# Patient Record
Sex: Male | Born: 1950 | Race: White | Hispanic: No | Smoking: Never smoker
Health system: Southern US, Community
[De-identification: ages and names within clinical notes are randomized; demographics above are authoritative.]

## PROBLEM LIST (undated history)

## (undated) DIAGNOSIS — R1013 Epigastric pain: Secondary | ICD-10-CM

## (undated) DIAGNOSIS — E785 Hyperlipidemia, unspecified: Secondary | ICD-10-CM

## (undated) DIAGNOSIS — I2510 Atherosclerotic heart disease of native coronary artery without angina pectoris: Secondary | ICD-10-CM

## (undated) DIAGNOSIS — Z006 Encounter for examination for normal comparison and control in clinical research program: Secondary | ICD-10-CM

## (undated) DIAGNOSIS — I255 Ischemic cardiomyopathy: Secondary | ICD-10-CM

## (undated) HISTORY — DX: Ischemic cardiomyopathy: I25.5

## (undated) HISTORY — PX: INGUINAL HERNIA REPAIR: SUR1180

## (undated) HISTORY — DX: Epigastric pain: R10.13

## (undated) HISTORY — DX: Encounter for examination for normal comparison and control in clinical research program: Z00.6

## (undated) HISTORY — PX: UMBILICAL HERNIA REPAIR: SHX196

## (undated) HISTORY — DX: Hyperlipidemia, unspecified: E78.5

## (undated) HISTORY — DX: Atherosclerotic heart disease of native coronary artery without angina pectoris: I25.10

---

## 2008-07-25 ENCOUNTER — Ambulatory Visit: Payer: Self-pay | Admitting: Cardiology

## 2008-07-25 ENCOUNTER — Inpatient Hospital Stay (HOSPITAL_COMMUNITY): Admission: EM | Admit: 2008-07-25 | Discharge: 2008-07-27 | Payer: Self-pay | Admitting: Cardiology

## 2008-08-18 ENCOUNTER — Ambulatory Visit: Payer: Self-pay | Admitting: Cardiology

## 2008-09-19 ENCOUNTER — Ambulatory Visit: Payer: Self-pay

## 2008-09-19 ENCOUNTER — Encounter: Payer: Self-pay | Admitting: Cardiology

## 2008-09-21 ENCOUNTER — Ambulatory Visit: Payer: Self-pay | Admitting: Cardiology

## 2008-12-29 DIAGNOSIS — I2510 Atherosclerotic heart disease of native coronary artery without angina pectoris: Secondary | ICD-10-CM

## 2008-12-29 DIAGNOSIS — I255 Ischemic cardiomyopathy: Secondary | ICD-10-CM

## 2008-12-29 DIAGNOSIS — E785 Hyperlipidemia, unspecified: Secondary | ICD-10-CM

## 2008-12-29 DIAGNOSIS — Z9861 Coronary angioplasty status: Secondary | ICD-10-CM

## 2008-12-30 ENCOUNTER — Encounter: Payer: Self-pay | Admitting: Cardiology

## 2008-12-30 ENCOUNTER — Ambulatory Visit: Payer: Self-pay | Admitting: Cardiology

## 2009-01-03 ENCOUNTER — Ambulatory Visit: Payer: Self-pay | Admitting: Cardiology

## 2009-01-03 LAB — CONVERTED CEMR LAB
AST: 20 units/L (ref 0–37)
Alkaline Phosphatase: 62 units/L (ref 39–117)
BUN: 12 mg/dL (ref 6–23)
Basophils Absolute: 0.1 10*3/uL (ref 0.0–0.1)
Bilirubin, Direct: 0.2 mg/dL (ref 0.0–0.3)
CO2: 29 meq/L (ref 19–32)
Calcium: 8.7 mg/dL (ref 8.4–10.5)
Chloride: 105 meq/L (ref 96–112)
Cholesterol: 120 mg/dL (ref 0–200)
Creatinine, Ser: 0.9 mg/dL (ref 0.4–1.5)
Eosinophils Absolute: 0.4 10*3/uL (ref 0.0–0.7)
HCT: 41.8 % (ref 39.0–52.0)
Hemoglobin: 14.3 g/dL (ref 13.0–17.0)
LDL Cholesterol: 69 mg/dL (ref 0–99)
Lymphs Abs: 1.9 10*3/uL (ref 0.7–4.0)
MCHC: 34.3 g/dL (ref 30.0–36.0)
Monocytes Absolute: 0.7 10*3/uL (ref 0.1–1.0)
Neutro Abs: 3.4 10*3/uL (ref 1.4–7.7)
Platelets: 185 10*3/uL (ref 150.0–400.0)
RDW: 12.4 % (ref 11.5–14.6)
Total CHOL/HDL Ratio: 3

## 2009-06-08 ENCOUNTER — Ambulatory Visit: Payer: Self-pay | Admitting: Cardiology

## 2009-06-08 DIAGNOSIS — R5381 Other malaise: Secondary | ICD-10-CM

## 2009-06-08 DIAGNOSIS — R5383 Other fatigue: Secondary | ICD-10-CM

## 2009-06-20 ENCOUNTER — Encounter (INDEPENDENT_AMBULATORY_CARE_PROVIDER_SITE_OTHER): Payer: Self-pay | Admitting: *Deleted

## 2009-06-20 LAB — CONVERTED CEMR LAB
Basophils Relative: 0.3 % (ref 0.0–3.0)
Calcium: 8.6 mg/dL (ref 8.4–10.5)
Creatinine, Ser: 0.9 mg/dL (ref 0.4–1.5)
Eosinophils Absolute: 0.4 10*3/uL (ref 0.0–0.7)
Eosinophils Relative: 5.4 % — ABNORMAL HIGH (ref 0.0–5.0)
GFR calc non Af Amer: 92.54 mL/min (ref >60)
Lymphocytes Relative: 25.5 % (ref 12.0–46.0)
MCHC: 35.5 g/dL (ref 30.0–36.0)
Monocytes Relative: 10.2 % (ref 3.0–12.0)
Neutrophils Relative %: 58.6 % (ref 43.0–77.0)
RBC: 4.2 M/uL — ABNORMAL LOW (ref 4.22–5.81)
WBC: 7 10*3/uL (ref 4.5–10.5)

## 2010-11-05 ENCOUNTER — Ambulatory Visit
Admission: RE | Admit: 2010-11-05 | Discharge: 2010-11-05 | Payer: Self-pay | Source: Home / Self Care | Attending: Cardiology | Admitting: Cardiology

## 2010-11-14 NOTE — Assessment & Plan Note (Signed)
Summary: ec6. previous pt of dr. Juanda Chance, cad. pt has bcbs. gd      Allergies Added:    Visit Type:  Follow-up Primary Provider:  Dr. Alben Spittle ([facility])  CC:  chest pain.  History of Present Illness: The patient presents for yearly followup. Since he was last seen here he has had no new cardiovascular problems. He exercises aerobically routinely and watches his diet. He has had no new cardiovascular symptoms and denies any chest pressure, neck or arm discomfort. He has had no palpitations, presyncope or syncope. He has had no weight gain or edema.  Current Medications (verified): 1)  Aspirin 81 Mg Tbec (Aspirin) .... Take One Tablet By Mouth Daily 2)  Crestor 20 Mg Tabs (Rosuvastatin Calcium) .... Take One Tablet By Mouth Daily. 3)  Plavix 75 Mg Tabs (Clopidogrel Bisulfate) .... Take One Tablet By Mouth Daily 4)  Metoprolol Succinate 25 Mg Xr24h-Tab (Metoprolol Succinate) .... Take One Tablet By Mouth Daily 5)  Nitroglycerin 0.4 Mg Subl (Nitroglycerin) .... One Tablet Under Tongue Every 5 Minutes As Needed For Chest Pain---May Repeat Times Three  Allergies (verified): 1)  ! Cordelia Poche  Past History:  Past Medical History: Reviewed history from 12/29/2008 and no changes required. 1. 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 2. Ejection fraction of 35-40%, now improved at 55% by recent echo. 3. Positive family history of coronary heart disease. 4. Hyperlipidemia.   Review of Systems       As stated in the HPI and negative for all other systems.   Vital Signs:  Patient profile:   60 year old male Height:      67 inches Weight:      197 pounds BMI:     30.97 Pulse rate:   67 / minute Resp:     16 per minute BP sitting:   142 / 76  (right arm)  Vitals Entered By: Marrion Coy, CNA (November 05, 2010 10:58 AM)  Physical Exam  General:  Well  developed, well nourished, in no acute distress. Head:  normocephalic and atraumatic Neck:  Neck supple, no JVD. No masses, thyromegaly or abnormal cervical nodes. Chest Wall:  no deformities or breast masses noted Lungs:  Clear bilaterally to auscultation and percussion. Abdomen:  Bowel sounds positive; abdomen soft and non-tender without masses, organomegaly, or hernias noted. No hepatosplenomegaly. Msk:  Back normal, normal gait. Muscle strength and tone normal. Extremities:  No clubbing or cyanosis. Neurologic:  Alert and oriented x 3. Skin:  Intact without lesions or rashes. Cervical Nodes:  no significant adenopathy Inguinal Nodes:  no significant adenopathy Psych:  Normal affect.   Detailed Cardiovascular Exam  Neck    Carotids: Carotids full and equal bilaterally without bruits.      Neck Veins: Normal, no JVD.    Heart    Inspection: no deformities or lifts noted.      Palpation: normal PMI with no thrills palpable.      Auscultation: regular rate and rhythm, S1, S2 without murmurs, rubs, gallops, or clicks.    Vascular    Abdominal Aorta: no palpable masses, pulsations, or audible bruits.      Femoral Pulses: normal femoral pulses bilaterally.      Pedal Pulses: normal pedal pulses bilaterally.      Radial Pulses: normal radial pulses bilaterally.      Peripheral Circulation: no  clubbing, cyanosis, or edema noted with normal capillary refill.     EKG  Procedure date:  11/05/2010  Findings:      Sinus rhythm, rate 67, axis within normal limits, intervals within normal limits, poor anterior R-wave progression  Impression & Recommendations:  Problem # 1:  CAD, NATIVE VESSEL (ICD-414.01) He has had no new symptoms. He anticipates aggressively and risk reduction. No change in therapy or further testing is indicated. I did review the results of his catheterization from 2009. He had very minimal plaque elsewhere. Orders: EKG w/ Interpretation (93000)  Problem # 2:   HYPERLIPIDEMIA-MIXED (ICD-272.4) He does not recall the specifics of his last lipid profile. This is followed by his primary provider. We did screen him for one of our clinical trials regarding HDL and we will let his primary provider note if he agrees to participate.  Problem # 3:  CARDIOMYOPATHY, ISCHEMIC (ICD-414.8) This has resolved. He had no significant LV dysfunction because of timely revascularization. Orders: EKG w/ Interpretation (93000)  Patient Instructions: 1)  Your physician wants you to follow-up in:  1 year with Dr Hoyle Barr will receive a reminder letter in the mail two months in advance. If you don't receive a letter, please call our office to schedule the follow-up appointment. 2)  Your physician recommends that you continue on your current medications as directed. Please refer to the Current Medication list given to you today.

## 2011-02-19 NOTE — Cardiovascular Report (Signed)
NAMEDAMIEL, BARTHOLD NO.:  [id number]   MEDICAL RECORD NO.:  [id number]          PATIENT TYPE:  OIB   LOCATION:  2905                         FACILITY:  [facility]   PHYSICIAN:  [name]. Juanda Chance, MD, FACCDATE OF BIRTH:  [birth year]   DATE OF PROCEDURE:  07/25/2008  DATE OF DISCHARGE:                            CARDIAC CATHETERIZATION   CLINICAL HISTORY:  Mr. Stonebraker is 60 years old and works Advertising copywriter  for cars.  He has no prior history of known heart disease, but does have  an elevated cholesterol.  He developed chest pain and went to the [facility] where his ECG showed an acute anterior wall  infarction.  He was transferred promptly to [facility] for  intervention.  He was initially seen by Dr. Swaziland and Dr. Swaziland  performed a diagnostic procedure, but the family requested our group to  be involved and I performed the intervention.   PROCEDURE:  The diagnostic procedure was performed by Dr. Swaziland which  showed minimal irregularities in the right coronary artery and  circumflex artery and 95% focal stenosis in the proximal LAD with TIMI  III flow.  His left ventriculogram showed anterolateral and apical wall  hypo-to-akinesis with an estimated ejection fraction of 35-40%.   The patient was given bivalirudin bolus and infusion.  He was given 600  mg of Plavix and he was given 4 chewable aspirin.  We used a Q4 6-French  guiding catheter with side holes.  We passed a Prowater wire down the  LAD across the lesion without difficulty.  We predilated with a 3.0 x 15-  mm apex balloon performing 1 inflation of 8 atmospheres for 30 seconds.  We then deployed a 3.0 x 15-mm endeavor stent deploying this with 1  inflation of 15 atmospheres for 30 seconds.  We postdilated with a 3.5 x  12-mm [place] Voyager performing 1 inflation up to 15 atmospheres for 30  seconds.  Final diagnostics were then performed through the guiding  catheter.  The patient  tolerated the procedure well and left the  laboratory in satisfactory condition.  Right femoral artery was closed  with Angio-Seal at the end of the procedure.   RESULTS:  Initially, the stenosis in the proximal LAD was estimated at  95%.  Following stenting, this improved to 0%.  Flow was TIMI III before  and after the intervention.   The patient had pain at 1345 and arrived in the [facility] at 1357.  He arrived in the cath lab at 1453 and the first  balloon inflation was at 1534.  I was scrubbed in procedure and there  was about a 10-minute delay from the time Dr. Swaziland finished the  diagnostic procedure until I was available for the intervention.  The  total balloon time was 97 minutes.   CONCLUSION:  1. Acute anterior wall myocardial infarction with 95% stenosis in the      proximal LAD, no major obstruction in the circumflex and right      coronary arteries and anterolateral  and apical wall hypo-to-      akinesis with an estimated ejection fraction of 35-40%.  2. Successful PCI of the lesion in the proximal LAD using a Medtronic      endeavor drug-eluting stent with improvement in the central      narrowing from 95%-0%.   DISPOSITION:  The patient was returned to postanesthesia unit for  further observation.      Bruce Elvera Lennox Juanda Chance, MD, [facility]  Electronically Signed     BRB/MEDQ  D:  07/25/2008  T:  07/26/2008  Job:  [id number]   cc:   [name], M.D.  Cecil Cranker

## 2011-02-19 NOTE — Assessment & Plan Note (Signed)
[facility] HEALTHCARE                            CARDIOLOGY OFFICE NOTE   Douglas, Kelley                           MRN:          [id number]  DATE:09/21/2008                            DOB:          Apr 07, 1951    PRIMARY CARE PHYSICIAN:  Dr. Daneil Dolin in [place].   CLINICAL HISTORY:  [name] is a 60 year old and returned for followup  management of his coronary heart disease after his recent heart attack.  He works as an Production manager for cars and his wife,  Douglas Kelley, works in recovery at [facility] [facility].  He was admitted to the  [facility] with an anterior wall  infarction, transferred to [facility], and underwent stenting of the proximal  LAD with an Endeavor drug-eluting stent.  The diagnostic procedure was  performed by Dr. Swaziland and I performed the intervention.  His ejection  fraction is good at 35-40%, and he had no disease in the circumflex or  right coronary artery.   He has done quite well since that time.  He has had no recurrent chest  pain, shortness of breath, or palpitations.  He is back at work.   He had an echocardiogram today which showed an ejection fraction of 55%,  only very mild apical hypokinesis.   His past medical history is negative for hypertension and diabetes.  He  does have hyperlipidemia.   He has an allergy or an intolerance to [facility].   His current medications include:  1. Crestor.  2. Aspirin.  3. Plavix.  4. Lotensin 5 mg daily.  5. Metoprolol 25 mg daily.   On examination, the blood pressure was 113/75 and pulse 49 and regular.  There was no venous distension.  Carotid pulses were full without  bruits.  Chest was clear.  Cardiac rhythm was regular.  He had no  murmurs or gallops.  The abdomen was soft with normal bowel sounds.  Peripheral pulses were full.  There was no peripheral edema.   IMPRESSION:  1. Coronary artery status post anterior wall myocardial  infarction,      July 25, 2008, treated with an Endeavor drug-eluting stent to      the left anterior descending.  2. Ejection fraction of 35-40%, now improved at 55% by recent echo.  3. Positive family history of coronary heart disease.  4. Hyperlipidemia.   RECOMMENDATIONS:  Mr. Rubenstein is doing quite well.  Laboratory studies  done at [facility] showed total cholesterol of 104 and HDL of 35 and  LDL of 62.  LDL has come down dramatically from his previous readings.   We will plan to continue his current medications.  I will plan to see  him back in 3 months.  We will probably consider getting him off  Lotensin or the metoprolol and followup with him.      Bruce Elvera Lennox Juanda Chance, MD, [facility]  Electronically Signed    BRB/MedQ  DD: 09/21/2008  DT: 09/21/2008  Job #: [id number]

## 2011-02-19 NOTE — Discharge Summary (Signed)
[name], [name]                  ACCOUNT NO.:  [id number]   MEDICAL RECORD NO.:  [id number]          PATIENT TYPE:  OIB   LOCATION:  2921                         FACILITY:  [facility]   PHYSICIAN:  [name]. Juanda Chance, MD, FACCDATE OF BIRTH:  09-15-1951   DATE OF ADMISSION:  07/25/2008  DATE OF DISCHARGE:  07/27/2008                               DISCHARGE SUMMARY   PROCEDURES:  1. Cardiac catheterization.  2. Coronary arteriogram.  3. Left ventriculogram.   PRIMARY FINAL DISCHARGE DIAGNOSES:  1. ST elevation anterior myocardial infarction.  2. Dyslipidemia with a total cholesterol of 163, triglycerides 68, HDL      39, and LDL 110 on this admission.  3. Ischemic cardiomyopathy with an ejection fraction of 35-40% by cath      this admission.  4. Allergy or intolerance to Levaquin.  5. Remote history of hernia repair and hand surgeries.  6. Borderline obesity with a body mass index of 30.3 kg.   TIME AT DISCHARGE:  37 minutes.   HOSPITAL COURSE:  [name] is a 60 year old male with no previous of  coronary artery disease.  He had been having left arm discomfort and  chest discomfort with exertion.  The discomfort became more pronounced  and then unbearable.  His EKG showed an ST elevation anterior MI and he  was brought urgently to [facility] into the Cath Lab.   The cardiac catheterization showed a 95% proximal LAD, treated with an  Endeavor drug-eluting stent reducing the stenosis to zero.  The  circumflex and RCA were basically normal with only some luminal  irregularities in the RCA.  His EF was 35%-40% with anterolateral and  apical akinesis.   Mr. [name] was started on Crestor 20 mg for dyslipidemia.  He was seen by  cardiac rehab.  He was enrolled in the ADAPT-DES study and will be  followed by research.  He had low-dose ACE inhibitor and beta-blocker  added to his medication regimen and is currently tolerating those fairly  well.   On July 27, 2008, Mr. [name]  was ambulating with cardiac rehab and had  no chest pain or shortness of breath.  His vitals were stable.  Dr.  Juanda Chance evaluated Mr. [name] and considered him stable for discharge with  close outpatient followup.   DISCHARGE INSTRUCTIONS:  His activity levels to include, no driving for  5 days and no lifting for 3 weeks.  He is to stick to a low-sodium heart-  healthy diet.  He is to call our office for problems with the cath site.  He is to follow up with Dr. Juanda Chance on August 18, 2008, at 11:45 and  with Dr. Alben Spittle at [facility] as needed.   DISCHARGE MEDICATIONS:  1. Crestor 20 mg daily.  2. Aspirin 325 mg daily.  3. Plavix 75 mg daily.  4. Nitroglycerin sublingual p.r.n.  5. Lotensin 5 mg daily.  6. Metoprolol ER 25 mg a day.      Theodore Demark, PA-C      Bruce R. Juanda Chance, MD, [facility]  Electronically Signed  RB/MEDQ  D:  07/27/2008  T:  07/28/2008  Job:  [id number]   cc:   Dr. Alben Spittle

## 2011-02-19 NOTE — Assessment & Plan Note (Signed)
[facility]ALTHCARE                            CARDIOLOGY OFFICE NOTE   SHERLOCK, NANCARROW                           MRN:          [id number]  DATE:08/18/2008                            DOB:          01/08/1951    PRIMARY CARE PHYSICIAN:  Tereso Newcomer, PA-C   CLINICAL HISTORY:  Mr. Vazquez is 60 years old and returned for a followup  visit after his recent anterior infarction.  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  His wife, [name], works in  recovery at [employer], who is with him today.  He developed chest  pain and went to the [facility], where his  ECG showed an acute anterior wall infarction.  His catheterization was  done by Dr. Peter Swaziland, but his family requested [place], and so I did  the intervention.  He had a high-grade lesion in the proximal LAD that  we treated with a Medtronic Endeavor drug-eluting stent.  His ejection  fraction was 35-40%.  He had no significant disease in the circumflex or  right coronary artery.   He has done quite well since discharge with no chest pain, shortness of  breath, or palpitations.   PAST MEDICAL HISTORY:  Negative for major medical illnesses.  He has had  no history of diabetes, hypertension, or known hypertension.  He has had  a history of hyperlipidemia, was treated with Pravachol in the past, but  not recently.  He has an allergy or intolerance to [facility].  He has a  history of a hernia repair.   CURRENT MEDICATIONS:  1. Aspirin.  2. Plavix.  3. Crestor 20 mg daily.  4. Lotensin 5 mg daily.  5. Metoprolol 25 mg daily.   PHYSICAL EXAMINATION:  VITAL SIGNS:  Blood pressure is 110/76 and pulse  70 and regular.  NECK:  There is no venous distension.  The carotid pulses are full  without bruits.  CHEST:  Clear.  CARDIAC:  Rhythm is regular.  There are no murmurs or gallops.  ABDOMEN:  Soft with normal bowel sounds.  EXTREMITIES:  Peripheral pulses  are full with no peripheral edema.   An electrocardiogram showed recent anterior wall infarction with  anterolateral T-wave changes.   IMPRESSION:  1. Coronary artery disease with recent anterior wall myocardial      infarction, treated with a drug-eluting stent to the left anterior      descending coronary artery.  2. Ejection fraction 35-40%.  3. Positive family history of coronary artery disease.  4. Hyperlipidemia.   RECOMMENDATIONS:  I think, Mr. Hanshaw is doing very well.  I think, it is  okay for him to return to work with no plans for driving in the next  month.  He and his wife had initially planned to go on a skiing trip in  January, but I think with Plavix, it is best that he not do that.  We  will try and get him involved in the cardiac rehab program.  We will  plan to see him back  in 4 weeks, and I will do an echocardiogram before  that visit to evaluate the recovery of his left ventricular function.     Bruce Elvera Lennox Juanda Chance, MD, [facility]  Electronically Signed    BRB/MedQ  DD: 08/18/2008  DT: 08/19/2008  Job #: [id number]

## 2011-02-19 NOTE — H&P (Signed)
NAMEVAUGHN, Douglas [name]:  [id number]   MEDICAL RECORD NO.:  [id number]          PATIENT TYPE:  OIB   LOCATION:  2905                         FACILITY:  [facility]   PHYSICIAN:  [name]. Juanda Chance, MD, FACCDATE OF BIRTH:  December 02, 1950   DATE OF ADMISSION:  07/25/2008  DATE OF DISCHARGE:                              HISTORY & PHYSICAL   CARDIOLOGIST:  [name]. Juanda Chance, MD, [facility]   PRIMARY CARE PHYSICIAN:  Dr. Alben Spittle.   HISTORY OF PRESENT ILLNESS:  Douglas Douglas Kelley is a 60 year old Caucasian  gentleman with no known coronary artery disease history who presents  with 3 weeks of intermittent chest discomfort brought on with exertion  and associated shortness of breath with GERD-type symptoms.  Douglas Douglas Kelley  is relatively active, works out at [employer] as often as he can.  He is an  Hotel manager.  Does a lot of traveling for work, was in [place]  just a couple of weeks ago walking through customs at the report  experienced some heaviness in his chest associated with shortness of  breath.  This continued for 10-50 minutes and then became associated  with numbness and tingling in his left arm.  He has tried some PPI  without relief in his symptoms.  Today, the discomfort became more  pronounced.  He was actually driving to his primary care office to get  evaluated when pain became unbearable, and he proceeded [facility] the [facility] at [facility] [address] where he was diagnosed with an ST  elevated MI anterior lateral.  The patient was transported urgently to  [facility] to the cath lab and is currently being prepped for cardiac  catheterization by Dr. Charlies Constable.   PAST MEDICAL HISTORY:  Hernia repair, remote hand surgery and mildly  elevated cholesterol.   ALLERGIES:  Questionable allergy to [facility].   MEDICATIONS:  No prescription medications.   SOCIAL HISTORY:  He lives at [facility] with his wife.  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  He drinks socially.   Denies  any tobacco use.  Works out at [employer]   FAMILY HISTORY:  Noncontributory for CAD.   REVIEW OF SYSTEMS:  Positive for chest pain, shortness of breath with  exertion, GERD symptoms, and left arm discomfort.   PHYSICAL EXAMINATION:  VITAL SIGNS:  Temperature 97.3, blood pressure  152/99, heart rate 88, 100% on 2 liters, and respirations 20.  GENERAL:  Douglas Douglas Kelley complaining of chest discomfort and left arm  discomfort.  HEENT: Unremarkable.  NECK: Supple without lymphadenopathy, no bruits.  CARDIOVASCULAR:  S1 and S2.  Regular rate and rhythm.  LUNGS: Clear to auscultation.  ABDOMEN: Soft, nontender, and positive bowel sounds.  EXTREMITIES:  Lower extremities without clubbing, cyanosis or edema.  NEUROLOGICAL:  Alert and oriented x3.   LABORATORY DATA:  Chest x-ray is pending.  A 12-lead EKG is pending.  However, EKG obtained at the [facility] road showing ST  depression with ST elevation anterior lateral leads.  Lab work is  pending.   IMPRESSION:  ST-segment elevation myocardial infarction.  The patient  being prepped for cardiac catheterization.  He would be admitted to CCU  for further care postcatheterization.  The risks and benefits of cardiac  catheterization have been discussed with the patient.  The patient  agrees to proceed.      Dorian Pod, ACNP      Bruce R. Juanda Chance, MD, [facility]  Electronically Signed    MB/MEDQ  D:  07/25/2008  T:  07/26/2008  Job:  [id number]

## 2011-02-19 NOTE — Cardiovascular Report (Signed)
NAME:  Douglas Kelley, TRACZ NO.:  [id number]   MEDICAL RECORD NO.:  [id number]           PATIENT TYPE:   LOCATION:                                 FACILITY:   PHYSICIAN:  [name], M.D.  DATE OF BIRTH:  1951-03-19   DATE OF PROCEDURE:  07/25/2008  DATE OF DISCHARGE:                            CARDIAC CATHETERIZATION   INDICATION FOR PROCEDURE:  A 60 year old white male with history of  hypercholesterolemia presented to the [facility] Emergency Department at  [facility] with acute anterolateral myocardial infarction.  He had  ongoing chest pain.  He was treated with IV heparin, nitroglycerin,  Lopressor, and oral Plavix in the emergency department and transported  emergently to cardiac catheterization laboratory.   PROCEDURES:  1. Left heart catheterization.  2. Coronary and left ventricular angiography.   EQUIPMENTS USED:  1. A 6-French 4 cm right and left Judkins catheter.  2. A 6-French pigtail catheter.  3. A 6-French arterial sheath.   MEDICATIONS:  Local anesthesia 1% Xylocaine and fentanyl 25 mg IV.   ACT was 238 seconds at the beginning of procedure.   CONTRAST:  90 mL of Omnipaque for the diagnostic procedure.   HEMODYNAMIC DATA:  Aortic pressure was 127/87 with a mean of 106 mmHg.  Left ventricle pressure is 113 with EDP of 12 mmHg.   ANGIOGRAPHIC DATA:  The left coronary artery arises and distributes  normally.  The left main coronary has 10-20% narrowing in the distal  vessel.   The left anterior descending artery is a large vessel extending out to  the apex.  It gives rise 2 diagonal branches.  In the proximal vessel,  there is a high-grade focal stenosis up to 99% with small filling defect  consistent with thrombus.  There is TIMI grade 2 flow to the distal LAD.  Remainder of the LAD and diagonal vessels are without significant  disease.   The left circumflex coronary artery gives rise to small intermediate  branch.  There is then a  large first obtuse marginal vessel.  The  circumflex then extends in the AV groove giving rise to a more  posterolateral branch.  Left circumflex coronary has only mild  irregularities, less than 10%.   Right coronary artery arises and distributes normally.  It is a large  dominant vessel and appears normal throughout its course.   Left ventricular angiography was performed in the RAO view.  This  demonstrates normal left ventricle size.  There was moderate  anterolateral hypokinesia with overall mild left ventricular  dysfunction.  Ejection fraction is estimated at 45%.  There is no  significant mitral insufficiency.  There is minimal prolapse of the  posterior leaflet of mitral valve.   FINAL INTERPRETATION:  1. Single-vessel obstructive atherosclerotic coronary artery disease      involving the proximal LAD.  2. Mild left ventricular dysfunction with anterolateral wall motion      abnormality.   PLAN:  The patient was referred for catheter-based intervention of the  proximal LAD, to be performed by Dr. Charlies Constable.  ______________________________  [name], M.D.     PMJ/MEDQ  D:  07/26/2008  T:  07/26/2008  Job:  [id number]   cc:   [name]. Juanda Chance, MD, [facility]

## 2011-05-21 ENCOUNTER — Other Ambulatory Visit: Payer: Self-pay | Admitting: *Deleted

## 2011-05-21 MED ORDER — ATORVASTATIN CALCIUM 80 MG PO TABS: 80.0000 mg | ORAL_TABLET | Freq: Every day | ORAL | Status: DC

## 2011-05-30 ENCOUNTER — Other Ambulatory Visit: Payer: Self-pay | Admitting: *Deleted

## 2011-05-30 MED ORDER — ATORVASTATIN CALCIUM 80 MG PO TABS: 80.0000 mg | ORAL_TABLET | Freq: Every day | ORAL | Status: DC

## 2011-07-09 LAB — CARDIAC PANEL(CRET KIN+CKTOT+MB+TROPI)
CK, MB: 235.6 — ABNORMAL HIGH
Relative Index: 8.5 — ABNORMAL HIGH
Total CK: 1509 — ABNORMAL HIGH
Total CK: 2467 — ABNORMAL HIGH
Troponin I: 43.36

## 2011-07-09 LAB — BASIC METABOLIC PANEL
BUN: 5 — ABNORMAL LOW
Calcium: 8.7
Calcium: 9
Chloride: 104
Creatinine, Ser: 0.77
GFR calc Af Amer: >60
GFR calc Af Amer: >60
GFR calc non Af Amer: >60
Potassium: 3.8
Sodium: 139

## 2011-07-09 LAB — LIPID PANEL
Cholesterol: 163
HDL: 39 — ABNORMAL LOW
Total CHOL/HDL Ratio: 4.2

## 2011-07-09 LAB — POCT I-STAT, CHEM 8
Creatinine, Ser: 0.6
HCT: 39
Hemoglobin: 13.3
Potassium: 3.4 — ABNORMAL LOW
Sodium: 125 — ABNORMAL LOW

## 2011-07-09 LAB — CBC
HCT: 41.6
Hemoglobin: 14.3
MCHC: 34.5
MCV: 93.6
Platelets: 212
RBC: 4.42
RBC: 4.61
WBC: 10.1

## 2011-08-27 ENCOUNTER — Telehealth: Payer: Self-pay | Admitting: Cardiology

## 2011-08-27 ENCOUNTER — Ambulatory Visit (INDEPENDENT_AMBULATORY_CARE_PROVIDER_SITE_OTHER): Payer: BC Managed Care – PPO | Admitting: Internal Medicine

## 2011-08-27 ENCOUNTER — Encounter: Payer: Self-pay | Admitting: *Deleted

## 2011-08-27 ENCOUNTER — Encounter: Payer: Self-pay | Admitting: Internal Medicine

## 2011-08-27 VITALS — BP 125/79 | HR 68 | Ht 67.0 in | Wt 189.0 lb

## 2011-08-27 DIAGNOSIS — I2510 Atherosclerotic heart disease of native coronary artery without angina pectoris: Secondary | ICD-10-CM

## 2011-08-27 DIAGNOSIS — R12 Heartburn: Secondary | ICD-10-CM | POA: Insufficient documentation

## 2011-08-27 LAB — BASIC METABOLIC PANEL
BUN: 15 mg/dL (ref 6–23)
CO2: 26 mEq/L (ref 19–32)
Chloride: 103 mEq/L (ref 96–112)
Creat: 1.15 mg/dL (ref 0.50–1.35)
Glucose, Bld: 100 mg/dL — ABNORMAL HIGH (ref 70–99)
Potassium: 4.2 mEq/L (ref 3.5–5.3)

## 2011-08-27 LAB — CBC WITH DIFFERENTIAL/PLATELET
Basophils Relative: 1 % (ref 0–1)
Eosinophils Absolute: 0.5 10*3/uL (ref 0.0–0.7)
Eosinophils Relative: 8 % — ABNORMAL HIGH (ref 0–5)
HCT: 41.1 % (ref 39.0–52.0)
Hemoglobin: 14.4 g/dL (ref 13.0–17.0)
MCH: 31.9 pg (ref 26.0–34.0)
MCHC: 35 g/dL (ref 30.0–36.0)
MCV: 90.9 fL (ref 78.0–100.0)
Monocytes Absolute: 0.6 10*3/uL (ref 0.1–1.0)
Monocytes Relative: 10 % (ref 3–12)
Neutro Abs: 3.3 10*3/uL (ref 1.7–7.7)

## 2011-08-27 NOTE — Patient Instructions (Signed)
Your physician has requested that you have a cardiac catheterization. Cardiac catheterization is used to diagnose and/or treat various heart conditions. Doctors may recommend this procedure for a number of different reasons. The most common reason is to evaluate chest pain. Chest pain can be a symptom of coronary artery disease (CAD), and cardiac catheterization can show whether plaque is narrowing or blocking your heart's arteries. This procedure is also used to evaluate the valves, as well as measure the blood flow and oxygen levels in different parts of your heart. For further information please visit [email or web address] Please follow instruction sheet, as given.  Your physician recommends that you lab work today: bmp/cbc/inr  Your physician recommends that you continue on your current medications as directed. Please refer to the Current Medication list given to you today.

## 2011-08-27 NOTE — Telephone Encounter (Signed)
New problem Pt called and said he is getting a lot of heartburn again. He wants to talk to you

## 2011-08-27 NOTE — Progress Notes (Signed)
  HPI  Douglas Kelley is a 60 y.o. male  Seen because of chest pain. His history of coronary artery disease with a 2 wall MI treated with Endeavor drug eluding stent October 2009 ejection fraction initially 35-40% Subsequentlyimproved to normal. At that time his presenting symptoms were a progressive heartburn unrelated to exertion and primarily related to meals. Apparently he had syncope driving himself to the doctor that day.  He is on well since then until about 3 or 4 weeks ago he developed recurrent "heartburn". This is associated with EEG and bending over. He is not bothered by elliptical training or anything. There is no change in his exercise tolerance. 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  Past Medical History  Diagnosis Date  . Coronary artery disease     Anterior wall MI-2009 treated with a drug-eluting stent  . Heartburn     Past Surgical History  Procedure Date  . Umbilical hernia repair   . Inguinal hernia repair     Current Outpatient Prescriptions  Medication Sig Dispense Refill  . aspirin 81 MG tablet Take 81 mg by mouth daily.        Marland Kitchen atorvastatin (LIPITOR) 80 MG tablet Take 1 tablet (80 mg total) by mouth daily.  30 tablet  prn  . benazepril (LOTENSIN) 10 MG tablet Take 10 mg by mouth daily.        . clopidogrel (PLAVIX) 75 MG tablet Take 75 mg by mouth daily.        . NON FORMULARY Study drug         Allergies  Allergen Reactions  . Levaquin     Review of Systems negative except from HPI and PMH  Physical Exam Well developed and well nourished in no acute distress HENT normal E scleral and icterus clear Neck Supple JVP flat; carotids brisk and full Clear to ausculation Regular rate and rhythm, no murmurs gallops or rub Soft with active bowel sounds No clubbing cyanosis and edema Alert and oriented, grossly normal motor and sensory function Skin Warm and Dry  ECG Sinus rhythm at 68 excellent intervals  0.18/0.07/0.37 Axis is 34 Q Waves in V1 and V2 old compared to 2010 No acute changes  Assessment and  Plan

## 2011-08-27 NOTE — Telephone Encounter (Signed)
Spoke with pt who reports having heartburn feelings like he did prior to his last MI.  The discomfort is becoming more frequent and leaves him feeling "sick".  Nothing really helps it.  He denies any SOB or other s/s.  Appointment given for today with Dr Graciela Husbands for evaluation.

## 2011-08-27 NOTE — Assessment & Plan Note (Signed)
Patient is status post DES for an anterior wall MI in 2009. There has been intercurrent normalization of LV function by last assessment in 2010. Given his chest pain we'll undertake a catheterization

## 2011-08-27 NOTE — Assessment & Plan Note (Addendum)
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 As a consequence, his pretest likelihood not withstanding the nonsuggestive nature of his symptoms remains high. We have reviewed the role of scanning versus catheterization and given the high pretest likelihood Given prior MI in the similarity to his prior symptoms, we will proceed with catheterization. We have reviewed the risks and benefits  I've also recommended that he started OTC PPI

## 2011-08-28 ENCOUNTER — Ambulatory Visit (INDEPENDENT_AMBULATORY_CARE_PROVIDER_SITE_OTHER)
Admission: RE | Admit: 2011-08-28 | Discharge: 2011-08-28 | Disposition: A | Discharge status: Home / Self Care | Payer: BC Managed Care – PPO | Source: Ambulatory Visit | Attending: Internal Medicine | Admitting: Internal Medicine

## 2011-08-28 DIAGNOSIS — I2510 Atherosclerotic heart disease of native coronary artery without angina pectoris: Secondary | ICD-10-CM

## 2011-08-28 DIAGNOSIS — R12 Heartburn: Secondary | ICD-10-CM

## 2011-08-30 ENCOUNTER — Encounter (HOSPITAL_BASED_OUTPATIENT_CLINIC_OR_DEPARTMENT_OTHER): Payer: Self-pay | Admitting: *Deleted

## 2011-08-30 ENCOUNTER — Inpatient Hospital Stay (HOSPITAL_BASED_OUTPATIENT_CLINIC_OR_DEPARTMENT_OTHER)
Admission: RE | Admit: 2011-08-30 | Discharge: 2011-08-30 | Disposition: A | Discharge status: Home / Self Care | Payer: BC Managed Care – PPO | Source: Ambulatory Visit | Attending: Cardiology | Admitting: Cardiology

## 2011-08-30 ENCOUNTER — Encounter (HOSPITAL_BASED_OUTPATIENT_CLINIC_OR_DEPARTMENT_OTHER)
Admission: RE | Disposition: A | Discharge status: Home / Self Care | Payer: Self-pay | Source: Ambulatory Visit | Attending: Cardiology

## 2011-08-30 DIAGNOSIS — I2510 Atherosclerotic heart disease of native coronary artery without angina pectoris: Secondary | ICD-10-CM | POA: Insufficient documentation

## 2011-08-30 DIAGNOSIS — Z9861 Coronary angioplasty status: Secondary | ICD-10-CM | POA: Insufficient documentation

## 2011-08-30 DIAGNOSIS — I252 Old myocardial infarction: Secondary | ICD-10-CM | POA: Insufficient documentation

## 2011-08-30 DIAGNOSIS — R079 Chest pain, unspecified: Secondary | ICD-10-CM | POA: Insufficient documentation

## 2011-08-30 LAB — D-DIMER, QUANTITATIVE: D-Dimer, Quant: <0.22 ug/mL-FEU (ref 0.00–0.48)

## 2011-08-30 SURGERY — JV LEFT HEART CATHETERIZATION WITH CORONARY ANGIOGRAM
Anesthesia: Moderate Sedation

## 2011-08-30 MED ORDER — ONDANSETRON HCL 4 MG/2ML IJ SOLN: 4.0000 mg | Freq: Four times a day (QID) | INTRAMUSCULAR | Status: DC | PRN

## 2011-08-30 MED ORDER — SODIUM CHLORIDE 0.9 % IV SOLN: 1.0000 mL/kg/h | INTRAVENOUS | Status: DC

## 2011-08-30 MED ORDER — ASPIRIN 81 MG PO CHEW: 324.0000 mg | CHEWABLE_TABLET | ORAL | Status: DC

## 2011-08-30 MED ORDER — ACETAMINOPHEN 325 MG PO TABS: 650.0000 mg | ORAL_TABLET | ORAL | Status: DC | PRN

## 2011-08-30 MED ORDER — SODIUM CHLORIDE 0.9 % IV SOLN: INTRAVENOUS | Status: DC

## 2011-08-30 NOTE — H&P (View-Only) (Signed)
  HPI  Douglas Kelley is a 59 y.o. male  Seen because of chest pain. His history of coronary artery disease with a 2 wall MI treated with Endeavor drug eluding stent October 2009 ejection fraction initially 35-40% Subsequentlyimproved to normal. At that time his presenting symptoms were a progressive heartburn unrelated to exertion and primarily related to meals. Apparently he had syncope driving himself to the doctor that day.  He is on well since then until about 3 or 4 weeks ago he developed recurrent "heartburn". This is associated with EEG and bending over. He is not bothered by elliptical training or anything. There is no change in his exercise tolerance. 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  Past Medical History  Diagnosis Date  . Coronary artery disease     Anterior wall MI-2009 treated with a drug-eluting stent  . Heartburn     Past Surgical History  Procedure Date  . Umbilical hernia repair   . Inguinal hernia repair     Current Outpatient Prescriptions  Medication Sig Dispense Refill  . aspirin 81 MG tablet Take 81 mg by mouth daily.        . atorvastatin (LIPITOR) 80 MG tablet Take 1 tablet (80 mg total) by mouth daily.  30 tablet  prn  . benazepril (LOTENSIN) 10 MG tablet Take 10 mg by mouth daily.        . clopidogrel (PLAVIX) 75 MG tablet Take 75 mg by mouth daily.        . NON FORMULARY Study drug         Allergies  Allergen Reactions  . Levaquin     Review of Systems negative except from HPI and PMH  Physical Exam Well developed and well nourished in no acute distress HENT normal E scleral and icterus clear Neck Supple JVP flat; carotids brisk and full Clear to ausculation Regular rate and rhythm, no murmurs gallops or rub Soft with active bowel sounds No clubbing cyanosis and edema Alert and oriented, grossly normal motor and sensory function Skin Warm and Dry  ECG Sinus rhythm at 68 excellent intervals  0.18/0.07/0.37 Axis is 34 Q Waves in V1 and V2 old compared to 2010 No acute changes  Assessment and  Plan  

## 2011-08-30 NOTE — OR Nursing (Signed)
Meal served 

## 2011-08-30 NOTE — Interval H&P Note (Signed)
History and Physical Interval Note:   08/30/2011   8:36 AM   Douglas Kelley  has presented today for surgery, with the diagnosis of  chest pain  The various methods of treatment have been discussed with the patient and family. After consideration of risks, benefits and other options for treatment, the patient has consented to  Procedure(s): JV LEFT HEART CATHETERIZATION WITH CORONARY ANGIOGRAM as a surgical intervention .  The patients' history has been reviewed, patient examined, no change in status, stable for surgery.  I have reviewed the patients' chart and labs.  Questions were answered to the patient's satisfaction.  Patient has prior Endeavor stent, and is involved in the reveal study.     Shawnie Pons  MD 08/30/2011 8:37 AM

## 2011-08-30 NOTE — OR Nursing (Signed)
Tegaderm dressing applied, site intact, no bleeding, bedrest began at 0945

## 2011-08-30 NOTE — OR Nursing (Signed)
Discharge instructions reviewed and signed, pt stated understanding. Site intact. Transported to wife's car via wheel chair.

## 2011-08-30 NOTE — Op Note (Signed)
Cardiac Catheterization Procedure Note  Name: Douglas Kelley MRN: [id number] DOB: 1950/10/20  Procedure: Left Heart Cath, Selective Coronary Angiography, LV angiography  Indication: Recurrent symptoms with prior MI and stent procedure   Procedural details: The right groin was prepped, draped, and anesthetized with 1% lidocaine. We first attempted to use the RFA.  The optimal location was identified by fluoro.   It was accessed, and wire placed.  We were able to advance the introducer, but not the sheath.  The wire kept coiling with advancement of the sheath despite optimal location under fluoro  (mid femoral head).  This access was abandoned and groin held for better than twenty minutes.   Using modified Seldinger technique, a 4 French sheath was easily introduced into the left  femoral artery. Standard Judkins catheters and a 3DRC were used for coronary angiography and left ventriculography. Catheter exchanges were performed over a guidewire. There were no immediate procedural complications. The patient was transferred to the post catheterization recovery area for further monitoring.  I reviewed the findings with his wife, and also with the patient.  He was stable.  Procedural Findings: Hemodynamics:  AO 103/79 (90) LV 112/13   Coronary angiography: Coronary dominance: right  Left mainstem: No significant disease  Left anterior descending (LAD): Moderately calcified and with previously placed stent proximally.  Less than 30% narrowing within the stent site.  The distal LAD wrapped the apex and was free of disease, as were the two major diagonal branches.    Left circumflex (LCx): The were two major marginals and an av circ.  The second OM had a kink with perhaps mild plaquing, but not more than 50%.  Right coronary artery (RCA): Mild calcification with large PDA and multiple branches in a large PLA system.  No significant obstruction noted.    Left ventriculography: Left ventricular systolic  function is mildly abnormal with modest anterolateral hypokinesis, EF calculated at 51%, there is no significant mitral regurgitation   Final Conclusions:  Widely patent LAD stent in the prior site of PCI without obstruction.  Recommendations: Continue medical therapy.  We will keep a little longer with both groin sites stuck.  Follow up in office.    Shawnie Pons 08/30/2011, 9:36 AM

## 2011-09-10 ENCOUNTER — Encounter: Payer: Self-pay | Admitting: Physician Assistant

## 2011-09-11 ENCOUNTER — Ambulatory Visit (INDEPENDENT_AMBULATORY_CARE_PROVIDER_SITE_OTHER): Payer: BC Managed Care – PPO | Admitting: Physician Assistant

## 2011-09-11 ENCOUNTER — Encounter: Payer: Self-pay | Admitting: Physician Assistant

## 2011-09-11 DIAGNOSIS — R12 Heartburn: Secondary | ICD-10-CM

## 2011-09-11 DIAGNOSIS — I2510 Atherosclerotic heart disease of native coronary artery without angina pectoris: Secondary | ICD-10-CM

## 2011-09-11 DIAGNOSIS — E785 Hyperlipidemia, unspecified: Secondary | ICD-10-CM

## 2011-09-11 NOTE — Patient Instructions (Signed)
Your physician wants you to follow-up in: 6 months with Dr. Hochrein.  You will receive a reminder letter in the mail two months in advance. If you don't receive a letter, please call our office to schedule the follow-up appointment.  

## 2011-09-11 NOTE — Assessment & Plan Note (Signed)
This seems to have resolved for the most part.  He can followup further with his PCP as needed.

## 2011-09-11 NOTE — Assessment & Plan Note (Signed)
LAD stent patent by recent cardiac catheterization.  Continue aspirin and Plavix.  Followup with Dr. Antoine Poche in 6 months.

## 2011-09-11 NOTE — Assessment & Plan Note (Signed)
Managed by PCP

## 2011-09-11 NOTE — Progress Notes (Signed)
  [address] [place], [place]  [address] Phone: [phone] Fax:  [phone]  Date:  09/11/2011   Name:  Douglas Kelley       DOB:  01/14/1951 MRN:  [id number]  PCP:  Dr. Daneil Dolin Primary Cardiologist:  Dr. Rollene Rotunda    History of Present Illness: Douglas Kelley is a 60 y.o. male who presents for post cath follow up.  He has a history of CAD, status post anterior MI in 2009 treated with an Endeavor drug-eluting stent to the LAD.  EF was 35-40% at the time of his MI.  Relook echocardiogram 12/09 demonstrated recovered LV function.  He was previously followed by Dr. Juanda Chance.  He recently established with Dr. Antoine Poche.  He presented as an add-on on 11/20 with complaints of heartburn.  This reminded him of his symptoms prior to his heart attack.  Cardiac catheterization was recommended.  This was performed 11/23 and demonstrated a patent stent in LAD and no significant obstructive CAD elsewhere.  His EF was 51%.  Access was attempted at the right femoral artery site but this was unsuccessful and this procedure was done of the left femoral artery site.  The patient denies chest pain, shortness of breath, syncope, orthopnea, PND or significant pedal edema.   Past Medical History  Diagnosis Date  . Coronary artery disease     Anterior wall MI-2009 treated with a drug-eluting stent;  LHC 08/30/11: LM ok, pLAD stent ok with < 30%, OM2 50%, EF 51%  . Dyspepsia   . HLD (hyperlipidemia)   . Ischemic cardiomyopathy     EF 35-40% at ant MI in 2009;  Echo 12/09: mild apical HK, EF 55%, mild MR    Current Outpatient Prescriptions  Medication Sig Dispense Refill  . aspirin 81 MG tablet Take 81 mg by mouth daily.        Marland Kitchen atorvastatin (LIPITOR) 80 MG tablet Take 1 tablet (80 mg total) by mouth daily.  30 tablet  prn  . benazepril (LOTENSIN) 10 MG tablet Take 10 mg by mouth daily.        . clopidogrel (PLAVIX) 75 MG tablet Take 75 mg by mouth daily.        . NON FORMULARY  Study drug        Allergies: Allergies  Allergen Reactions  . Levaquin     History  Substance Use Topics  . Smoking status: Never Smoker   . Smokeless tobacco: Not on file  . Alcohol Use: Yes     PHYSICAL EXAM: VS:  BP 122/78  Pulse 58  Resp 18  Ht 5\' 6"  (1.676 m)  Wt 188 lb (85.276 kg)  BMI 30.34 kg/m2 Well nourished, well developed, in no acute distress HEENT: normal Neck: no JVD Cardiac:  normal S1, S2; RRR; no murmur Lungs:  clear to auscultation bilaterally, no wheezing, rhonchi or rales Abd: soft, nontender, no hepatomegaly Ext: no edema; Bilateral femoral artery sites without hematoma or bruit Skin: warm and dry Neuro:  CNs 2-12 intact, no focal abnormalities noted  EKG:   Sinus bradycardia, heart rate 59, normal axis, low voltage, septal Q waves, no change from prior  ASSESSMENT AND PLAN:

## 2012-08-17 ENCOUNTER — Other Ambulatory Visit: Payer: Self-pay

## 2012-08-17 DIAGNOSIS — R12 Heartburn: Secondary | ICD-10-CM

## 2012-08-17 DIAGNOSIS — I2510 Atherosclerotic heart disease of native coronary artery without angina pectoris: Secondary | ICD-10-CM

## 2012-08-17 MED ORDER — NITROGLYCERIN 0.4 MG SL SUBL: 0.4000 mg | SUBLINGUAL_TABLET | SUBLINGUAL | Status: DC | PRN

## 2012-08-27 IMAGING — CR DG CHEST 2V
2 series · 2 of 2 positions shown · non-contrast
Comparison: None.

CLINICAL DATA: Chest pain.  Hypertension.  Ex-smoker.

CHEST - 2 VIEW

[view not recorded (1 of 2)]
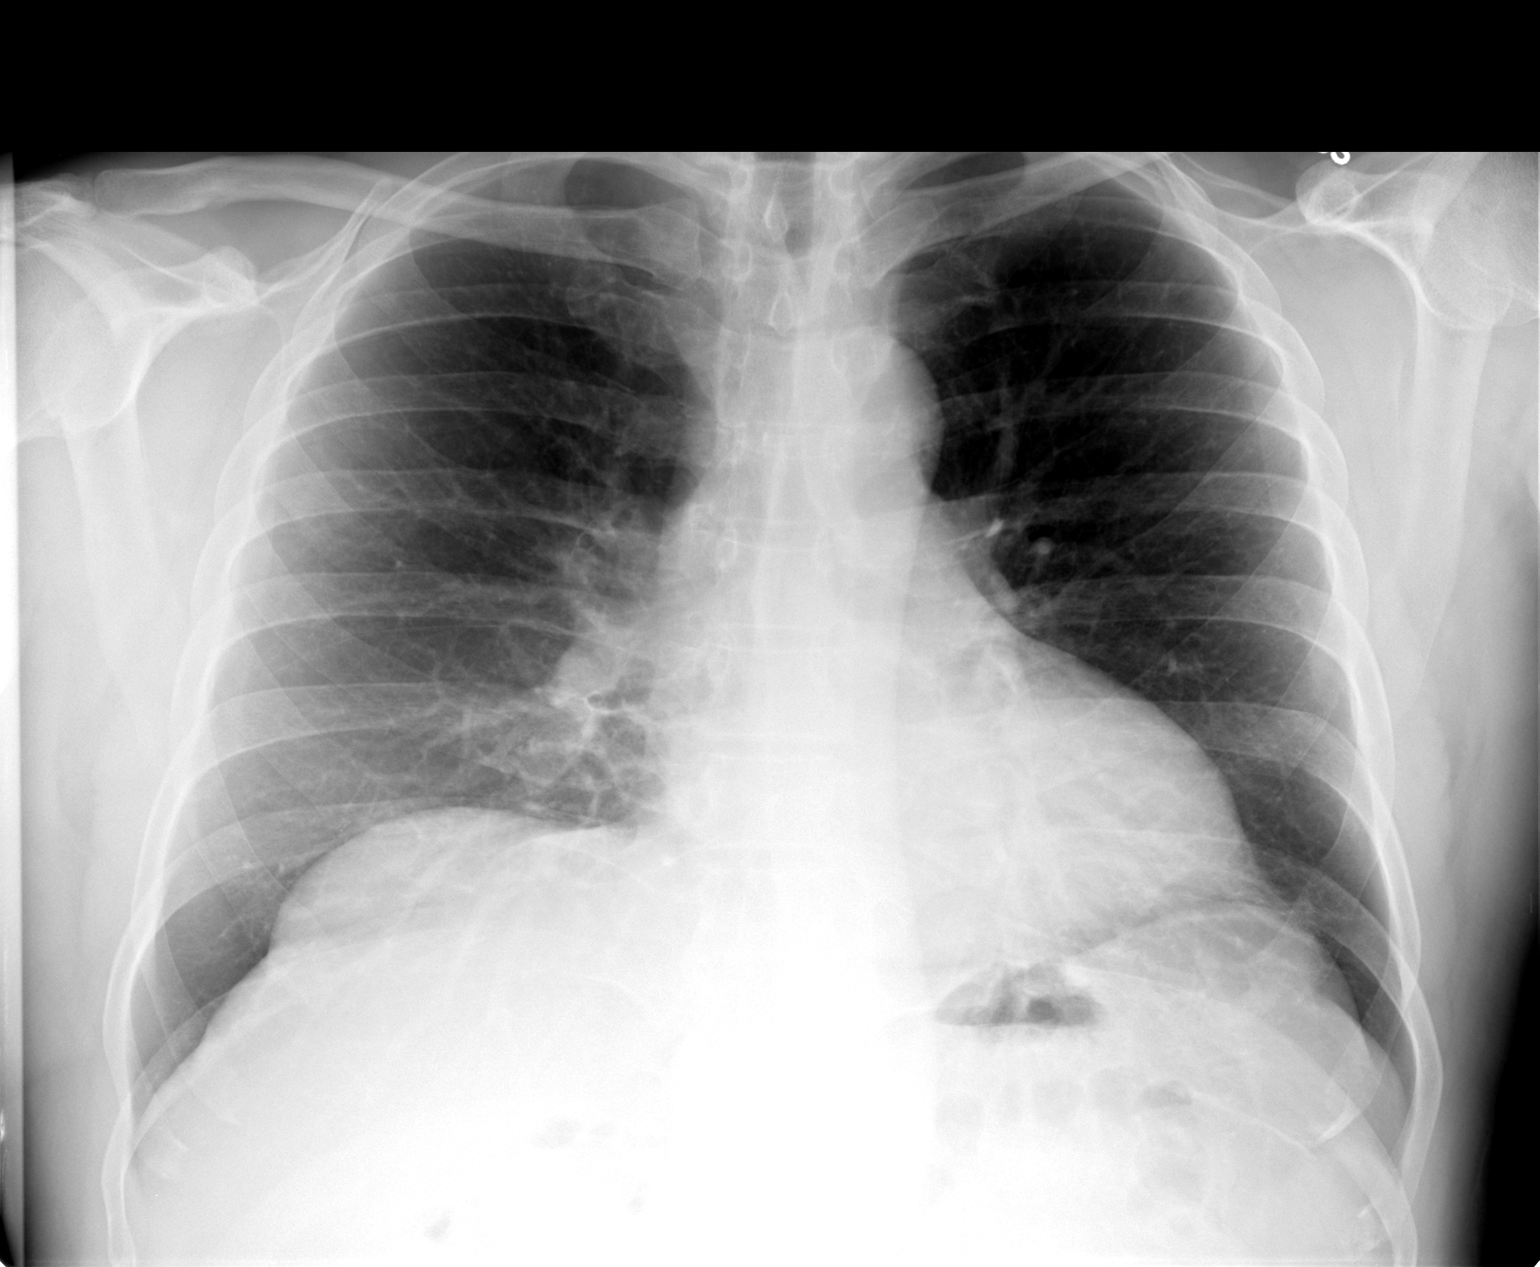

[view not recorded (2 of 2)]
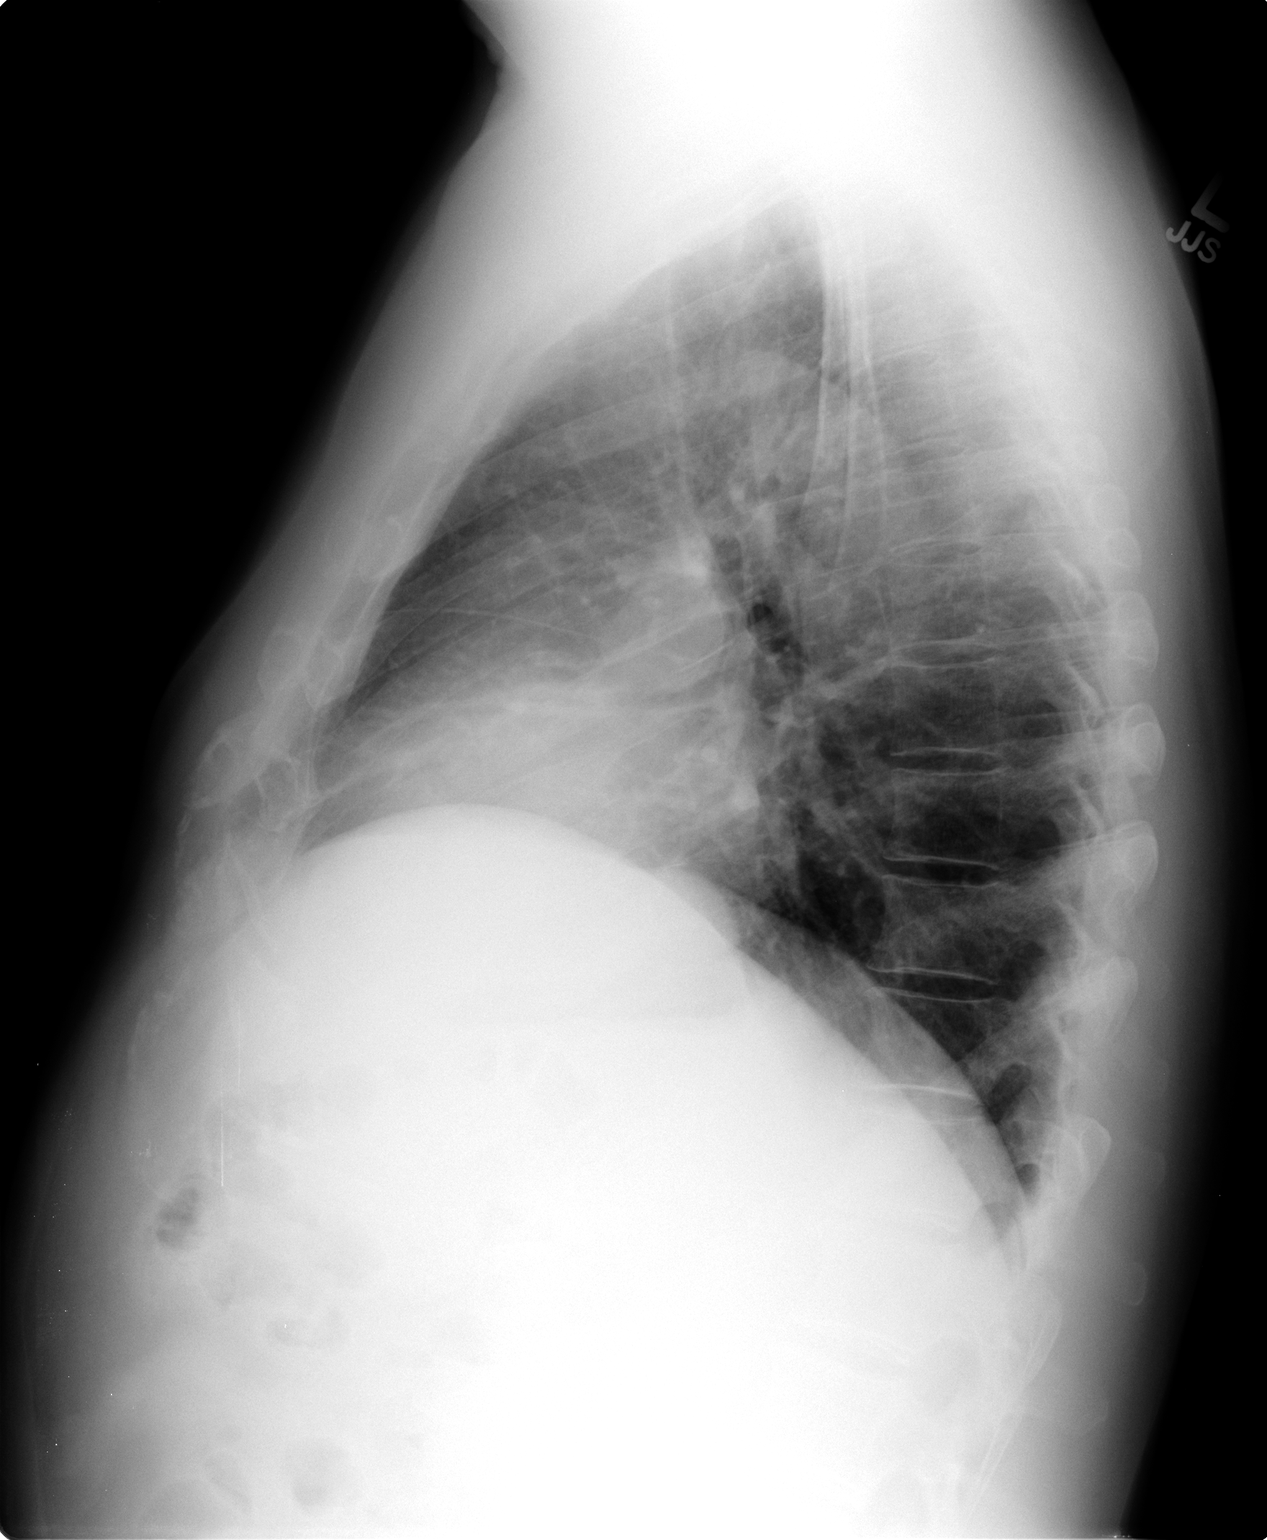

[2 of 2 positions shown; findings below may reference images not displayed]

FINDINGS: The cardiac silhouette is normal size and shape. Ectasia
and nonaneurysmal calcification of the thoracic aorta are seen.
None lungs are free of infiltrates.  No pleural disease is evident.
Bones appear average for age.
IMPRESSION: No acute or active cardiopulmonary or pleural abnormality is
evident.

## 2013-12-10 ENCOUNTER — Telehealth: Payer: Self-pay | Admitting: Cardiology

## 2013-12-10 NOTE — Telephone Encounter (Signed)
Left message for pt to call back  °

## 2013-12-10 NOTE — Telephone Encounter (Signed)
New problem   Pt is feeling lousy and having heart burn, stated he isn't feeling good. Pt would like to speak to nurse.

## 2013-12-15 NOTE — Telephone Encounter (Signed)
Finally able to contact pt.  States he had been out of the country the week before and felt bad.  He reports he started taking Pepcid AC twice a day and is feeling better now.

## 2014-01-10 ENCOUNTER — Encounter: Payer: Self-pay | Admitting: Physician Assistant

## 2014-01-10 ENCOUNTER — Ambulatory Visit (INDEPENDENT_AMBULATORY_CARE_PROVIDER_SITE_OTHER): Payer: BC Managed Care – PPO | Admitting: Physician Assistant

## 2014-01-10 VITALS — BP 118/86 | HR 67 | Ht 66.5 in | Wt 197.8 lb

## 2014-01-10 DIAGNOSIS — E785 Hyperlipidemia, unspecified: Secondary | ICD-10-CM

## 2014-01-10 DIAGNOSIS — I2510 Atherosclerotic heart disease of native coronary artery without angina pectoris: Secondary | ICD-10-CM

## 2014-01-10 NOTE — Patient Instructions (Signed)
Your physician wants you to follow-up in: 12 MONTHS WITH DR. HOCHREIN. You will receive a reminder letter in the mail two months in advance. If you don't receive a letter, please call our office to schedule the follow-up appointment.

## 2014-01-10 NOTE — Progress Notes (Signed)
[address] [place], [place]  [address] Phone: [phone] Fax:  [phone]  Date:  01/10/2014   ID:  Douglas Kelley, DOB 02-04-1951, MRN [id number]  PCP:  Elijio Miles, MD  Cardiologist:  Dr. 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 EF was 35-40% at the time of his MI. Relook echocardiogram 12/09 demonstrated recovered LV function. He was previously followed by Dr. Juanda Chance and now sees Dr. Antoine Poche. Last cath 08/29/2013 and demonstrated a patent stent in LAD and no significant obstructive CAD elsewhere. His EF was 51%.  Last seen 09/2011.  He travels quite a bit for his job. He has recently been to [place] and [place].  Since last seen, he has been doing well. He notes occasional chest heaviness while lying flat. Otherwise denies exertional chest discomfort. He exercises 3 days a week and does 30-40 minutes of cardiovascular activity as well as lifting weights. He denies orthopnea, PND or edema. Denies significant dyspnea. He denies syncope.   Recent Labs: No results found for requested labs within last 365 days.  Wt Readings from Last 3 Encounters:  01/10/14 197 lb 12.8 oz (89.721 kg)  09/11/11 188 lb (85.276 kg)  08/30/11 189 lb (85.73 kg)     Past Medical History  Diagnosis Date  . Coronary artery disease     Anterior wall MI-2009 treated with a drug-eluting stent;  LHC 08/30/11: LM ok, pLAD stent ok with < 30%, OM2 50%, EF 51%  . Dyspepsia   . HLD (hyperlipidemia)   . Ischemic cardiomyopathy     EF 35-40% at ant MI in 2009;  Echo 12/09: mild apical HK, EF 55%, mild MR  . Research study patient     REVEAL    Current Outpatient Prescriptions  Medication Sig Dispense Refill  . aspirin 81 MG tablet Take 81 mg by mouth daily.        . benazepril (LOTENSIN) 10 MG tablet Take 10 mg by mouth daily.        . clopidogrel (PLAVIX) 75 MG tablet  Take 75 mg by mouth daily.        . nitroGLYCERIN (NITROSTAT) 0.4 MG SL tablet Place 1 tablet (0.4 mg total) under the tongue every 5 (five) minutes as needed for chest pain.  25 tablet  1  . NON FORMULARY Study drug      . VIAGRA 100 MG tablet Take 100 mg by mouth as needed. NOT TAKE WITH NITRATES       No current facility-administered medications for this visit.    Allergies:   Levofloxacin   Social History:  The patient  reports that he has never smoked. He does not have any smokeless tobacco history on file. He reports that he drinks alcohol. He reports that he does not use illicit drugs.   Family History:  The patient's family history is not on file.   ROS:  Please see the history of present illness.      All other systems reviewed and negative.   PHYSICAL EXAM: VS:  BP 118/86  Pulse 67  Ht 5' 6.5" (1.689 m)  Wt 197 lb 12.8 oz (89.721 kg)  BMI 31.45 kg/m2 Well nourished, well developed, in no acute distress HEENT: normal Neck: no JVD Vascular: no carotid bruits Cardiac:  normal S1, S2; RRR; no murmur  Lungs:  clear to auscultation bilaterally, no wheezing, rhonchi or rales Abd: soft, nontender, no hepatomegaly Ext: no edema Skin: warm and dry Neuro:  CNs 2-12 intact, no focal abnormalities noted  EKG:  NSR, HR 67, normal axis, low voltage, septal Q waves, no significant ST changes since prior tracing     ASSESSMENT AND PLAN:  1. CAD s/p Prior MI:  Doing well without symptoms suggestive of angina.  He has occasional atypical chest pain.  However, he is quite active and has not exertional symptoms.  Continue current Rx.  He does report frequent bruising.  He has been on ASA and Plavix since his PCI in 2009.  Review of records reveals Dr. Juanda ChanceBrodie did not want to stop his Plavix.  Will review with Dr. Rollene RotundaJames Hochrein to see his thoughts.   2. Ischemic Cardiomyopathy:  EF has returned to normal by last assessment.  Continue current Rx.  3. Hyperlipidemia:  Continues to participate  in REVEAL trial. 4. Disposition:  F/u with Dr. Rollene RotundaJames Hochrein in 1 year or sooner as needed.    Signed, Tereso NewcomerScott Weaver, PA-C  01/10/2014 9:14 AM

## 2014-01-14 ENCOUNTER — Telehealth: Payer: Self-pay | Admitting: *Deleted

## 2014-01-14 NOTE — Telephone Encounter (Signed)
Note below copied from message from [place] Weaver, [place]. I spoke with pt and gave him this information  -----------------------------------------------------------------------------------------------------------------------------------------------  Please notify Nolene Ebbsaul Strike that I reviewed his case with Dr. Rollene RotundaJames Hochrein. We can stop his Plavix now. He should remain on ASA 81 mg QD inefinitely. Tereso NewcomerScott Weaver, PA-C 01/14/2014 1:46 PM

## 2014-01-14 NOTE — Progress Notes (Signed)
Reviewed with Dr. Rollene RotundaJames Hochrein. Ok to stop Plavix. Remain on ASA 81 QD indefinitely. Signed, Tereso NewcomerScott Izeah Vossler, PA-C   01/14/2014 1:47 PM

## 2015-07-14 ENCOUNTER — Telehealth: Payer: Self-pay | Admitting: Physician Assistant

## 2015-07-14 DIAGNOSIS — I2510 Atherosclerotic heart disease of native coronary artery without angina pectoris: Secondary | ICD-10-CM

## 2015-07-14 MED ORDER — ATORVASTATIN CALCIUM 80 MG PO TABS: 80.0000 mg | ORAL_TABLET | Freq: Every day | ORAL | Status: DC

## 2015-07-14 NOTE — Telephone Encounter (Signed)
Lmptcb on both home and cell # to go over recommendations of starting lipitor  80 mg with f/u in a few months per [employer]. PA.

## 2015-07-14 NOTE — Telephone Encounter (Signed)
See message below that I received from Claire Shown, RN with research. Will send in Rx for Atorvastatin 80 mg QD to [facility] on [address] and [address]. He can start this once his study drug is stopped. Please call Douglas Kelley and arrange routine FU with Dr. Rollene Rotunda or me in the next few months. Tereso Newcomer, PA-C   07/14/2015 1:32 PM     Message from Claire Shown, RN with Research: "Douglas Kelley has been taking part in the REVEAL Research Study for the past 4 years. As part of the study, he has been taking atorvastatin 80 mg daily and anacetrapib 100 mg or placebo daily. Both medications were provided to the patient as part of the study. The study has ended and he will need a prescription for a statin. It is recommended that he continues on a similar lipid-lowering potency to that which he has been taking during the study. Also, it is advised against measuring lipids at this stage as it may be difficult to interpret the results until several weeks after stopping the study treatment. He will stop all study medication on 07/20/2015.   So that statin therapy is not interrupted, could you please call in a prescription for statin therapy for him.   Thank you [name],   Claire Shown, RN, Research Nurse [phone])"

## 2015-07-19 ENCOUNTER — Telehealth: Payer: Self-pay | Admitting: *Deleted

## 2015-07-19 NOTE — Telephone Encounter (Signed)
I Lmptcb x 3 to go over recommendations as to starting Atorvastatin 10/13 since study drug is stopping 10/13.  

## 2015-07-19 NOTE — Telephone Encounter (Signed)
I Lmptcb x 3 to go over recommendations as to starting Atorvastatin 10/13 since study drug is stopping 10/13.

## 2015-07-20 NOTE — Telephone Encounter (Signed)
No DPR on file though I did lmom on wife's cell for ptcb that we have tried several times to reach him to go over recommendations after stopping study med. I did also message Bing NeighborsScott W. PA and research nurse, Claire ShownSally Milks, RN,I have not reached pt.

## 2015-07-20 NOTE — Telephone Encounter (Signed)
No DPR on file though I did lmom on wife's cell for ptcb that we have tried several times to reach him to go over recommendations after stopping study med. I did also message Scott W. PA and research nurse, Sally Milks, RN,I have not reached pt. 

## 2015-07-26 ENCOUNTER — Other Ambulatory Visit: Payer: Self-pay

## 2016-02-07 ENCOUNTER — Other Ambulatory Visit: Payer: Self-pay

## 2016-02-07 MED ORDER — ATORVASTATIN CALCIUM 80 MG PO TABS: 80.0000 mg | ORAL_TABLET | Freq: Every day | ORAL | Status: DC

## 2016-04-16 ENCOUNTER — Other Ambulatory Visit: Payer: Self-pay | Admitting: Cardiology

## 2016-08-22 ENCOUNTER — Telehealth: Payer: Self-pay | Admitting: Cardiology

## 2016-08-22 NOTE — Telephone Encounter (Signed)
Pt of Dr. Antoine PocheHochrein. Last seen 01/2014  I returned call to patient, who notes high BPs for the past 2-3 months w systolics ranging in the 150s and 160s, diastolics above 100.  He has been on benazepril and is still taking this, but no longer feels the medication is controlling his BP. He denies acute symptoms. He has not been seen in 2+ years and needs to reestablish care. I have made him aware that Dr. Antoine PocheHochrein is not in office with availability until mid-December. Patient amenable to being seen in interim for APP visit & medication review.  I have scheduled him for 8am Monday w [name]. I made sure patient is aware of the [facility] office location. He has our address and number to call if further concerns. He voiced thanks for prompt assistance.

## 2016-08-22 NOTE — Telephone Encounter (Signed)
New message    Pt c/o BP issue: STAT if pt c/o blurred vision, one-sided weakness or slurred speech  1. What are your last 5 BP readings? Today  160/110 retake @ 10 min ago  149/110  2. Are you having any other symptoms (ex. Dizziness, headache, blurred vision, passed out)? No  3. What is your BP issue? Does not think medication is working / benazepril (LOTENSIN) 10 MG tablet

## 2016-08-26 ENCOUNTER — Encounter: Payer: Self-pay | Admitting: Student

## 2016-08-26 ENCOUNTER — Ambulatory Visit (INDEPENDENT_AMBULATORY_CARE_PROVIDER_SITE_OTHER): Payer: BLUE CROSS/BLUE SHIELD | Admitting: Student

## 2016-08-26 VITALS — BP 135/94 | HR 78 | Ht 66.5 in | Wt 206.0 lb

## 2016-08-26 DIAGNOSIS — I2510 Atherosclerotic heart disease of native coronary artery without angina pectoris: Secondary | ICD-10-CM

## 2016-08-26 DIAGNOSIS — E785 Hyperlipidemia, unspecified: Secondary | ICD-10-CM | POA: Diagnosis not present

## 2016-08-26 DIAGNOSIS — K219 Gastro-esophageal reflux disease without esophagitis: Secondary | ICD-10-CM

## 2016-08-26 DIAGNOSIS — I10 Essential (primary) hypertension: Secondary | ICD-10-CM | POA: Diagnosis not present

## 2016-08-26 MED ORDER — BENAZEPRIL HCL 10 MG PO TABS: 10.0000 mg | ORAL_TABLET | Freq: Every day | ORAL | 3 refills | Status: DC

## 2016-08-26 MED ORDER — ATORVASTATIN CALCIUM 80 MG PO TABS
ORAL_TABLET | ORAL | 3 refills | Status: DC
Start: 2016-08-26 — End: 2017-11-16

## 2016-08-26 MED ORDER — NITROGLYCERIN 0.4 MG SL SUBL: 0.4000 mg | SUBLINGUAL_TABLET | SUBLINGUAL | 4 refills | Status: DC | PRN

## 2016-08-26 MED ORDER — NITROGLYCERIN 0.4 MG SL SUBL
0.4000 mg | SUBLINGUAL_TABLET | SUBLINGUAL | 4 refills | Status: DC | PRN
Start: 2016-08-26 — End: 2021-11-13

## 2016-08-26 NOTE — Progress Notes (Signed)
Cardiology Office Note    Date:  08/26/2016   ID:  Douglas Kelley, DOB May 15, 1951, MRN [id number]  PCP:  Elijio MilesWEAVER,JOHN W, MD  Cardiologist: Dr. Antoine PocheHochrein   Chief Complaint  Patient presents with  . Hypertension  . Heartburn    History of Present Illness:    Douglas Ebbsaul Friddle is a 65 y.o. male with past medical history of CAD (s/p DES to LAD in 2009, patent by cath in 2012), HTN, HLD and history of ischemic cardiomyopathy (EF 35-40% at time of MI, improved to 55% by echo in 2009) who presents to the office today for follow-up.   He was last examined by Tereso NewcomerScott Weaver, PA-C in 01/2014 and doing well at that time. Denied any chest discomfort or dyspnea with exertion. Reported exercising 3 days per week with no exertional symptoms noted.   Today, he reports overall doing well since his last office visit. He is traveling out of the country every 2 weeks for work and reports this is exhausting at times. Upon being back in the states, he works on restoring an older cabin. Is trying to exercise 2-3 times per week when his schedule allows. He denies any chest discomfort or dyspnea with exertion while exercising. He does have occasional heartburn symptoms, which are along the sternum and worse with consuming food. Takes Omeprazole with improvement in his symptoms. No association with exertion.  He has been checking his blood pressure frequently and notes that it has been elevated over the past month. Systolic readings have ranged from the 130's - 160's and diastolic readings in the 90's to low-100's. He reports good compliance with his medications. Still taking ASA, Lipitor, and Lotensin.   He recently completed a Wellness Profile which showed labs results of: HDL 49, LDL 83, total cholesterol 956152, creatinine 0.9, AST 25, ALT 45.   Past Medical History:  Diagnosis Date  . Coronary artery disease    Anterior wall MI-2009 treated with a drug-eluting stent;  LHC 08/30/11: LM ok, pLAD stent ok with < 30%, OM2  50%, EF 51%  . Dyspepsia   . HLD (hyperlipidemia)   . Ischemic cardiomyopathy    EF 35-40% at ant MI in 2009;  Echo 12/09: mild apical HK, EF 55%, mild MR  . Research study patient    REVEAL    Past Surgical History:  Procedure Laterality Date  . INGUINAL HERNIA REPAIR    . UMBILICAL HERNIA REPAIR      Current Medications: Outpatient Medications Prior to Visit  Medication Sig Dispense Refill  . aspirin 81 MG tablet Take 81 mg by mouth daily.      Marland Kitchen. VIAGRA 100 MG tablet Take 100 mg by mouth as needed. NOT TAKE WITH NITRATES    . atorvastatin (LIPITOR) 80 MG tablet TAKE 1 TABLET DAILY (PLEASE CALL OUR OFFICE AS SOON AS POSSIBLE, YOU ARE OVERDUE FOR AN APPOINTMENT) 90 tablet 1  . benazepril (LOTENSIN) 10 MG tablet Take 10 mg by mouth daily.      . nitroGLYCERIN (NITROSTAT) 0.4 MG SL tablet Place 1 tablet (0.4 mg total) under the tongue every 5 (five) minutes as needed for chest pain. 25 tablet 1   No facility-administered medications prior to visit.      Allergies:   Levofloxacin   Social History   Social History  . Marital status: Married    Spouse name: N/A  . Number of children: N/A  . Years of education: N/A   Social History Main Topics  .  Smoking status: Never Smoker  . Smokeless tobacco: None  . Alcohol use Yes  . Drug use: No  . Sexual activity: Not Asked   Other Topics Concern  . None   Social History Narrative  . None     Family History:  The patient's family history includes Heart attack in his mother.   Review of Systems:   Please see the history of present illness.     General:  No chills, fever, night sweats or weight changes.  Cardiovascular:  No chest pain, dyspnea on exertion, edema, orthopnea, palpitations, paroxysmal nocturnal dyspnea. Dermatological: No rash, lesions/masses Respiratory: No cough, dyspnea Urologic: No hematuria, dysuria Abdominal:   No nausea, vomiting, diarrhea, bright red blood per rectum, melena, or hematemesis. Positive  for heartburn. Neurologic:  No visual changes, wkns, changes in mental status. All other systems reviewed and are otherwise negative except as noted above.   Physical Exam:    VS:  BP (!) 135/94   Pulse 78   Ht 5' 6.5" (1.689 m)   Wt 206 lb (93.4 kg)   BMI 32.75 kg/m    General: Well developed, well nourished,male appearing in no acute distress. Head: Normocephalic, atraumatic, sclera non-icteric, no xanthomas, nares are without discharge.  Neck: No carotid bruits. JVD not elevated.  Lungs: Respirations regular and unlabored, without wheezes or rales.  Heart: Regular rate and rhythm. No S3 or S4.  No murmur, no rubs, or gallops appreciated. Abdomen: Soft, non-tender, non-distended with normoactive bowel sounds. No hepatomegaly. No rebound/guarding. No obvious abdominal masses. Msk:  Strength and tone appear normal for age. No joint deformities or effusions. Extremities: No clubbing or cyanosis. No edema.  Distal pedal pulses are 2+ bilaterally. Neuro: Alert and oriented X 3. Moves all extremities spontaneously. No focal deficits noted. Psych:  Responds to questions appropriately with a normal affect. Skin: No rashes or lesions noted  Wt Readings from Last 3 Encounters:  08/26/16 206 lb (93.4 kg)  01/10/14 197 lb 12.8 oz (89.7 kg)  09/11/11 188 lb (85.3 kg)     Studies/Labs Reviewed:   EKG:  EKG is ordered today. The EKG ordered today demonstrates NSR, HR 78, with no acute ST or T-wave changes when compared to previous tracings.   Recent Labs: No results found for requested labs within last 8760 hours.   Lipid Panel    Component Value Date/Time   CHOL 120 01/03/2009 0953   TRIG 61.0 01/03/2009 0953   HDL 38.50 (L) 01/03/2009 0953   CHOLHDL 3 01/03/2009 0953   VLDL 12.2 01/03/2009 0953   LDLCALC 69 01/03/2009 0953    Outside Labs: 06/2016: HDL 49, LDL 83, total cholesterol 409, creatinine 0.9, AST 25, ALT 45.   Additional studies/ records that were reviewed today  include:   Cardiac Catheterization: 07/2008  CONCLUSION:  1. Acute anterior wall myocardial infarction with 95% stenosis in the      proximal LAD, no major obstruction in the circumflex and right      coronary arteries and anterolateral and apical wall hypo-to-      akinesis with an estimated ejection fraction of 35-40%.  2. Successful PCI of the lesion in the proximal LAD using a Medtronic      endeavor drug-eluting stent with improvement in the central      narrowing from 95%-0%.  Assessment:    1. Essential hypertension   2. Atherosclerosis of native coronary artery of native heart without angina pectoris   3. Dyslipidemia   4. Gastroesophageal  reflux disease without esophagitis      Plan:   In order of problems listed above:  1. Essential HTN - patient reports systolic readings in the 130's - 160's and diastolic readings in the 90's to low-100's over the past month. Denies any associated headaches, dizziness, or lightheadedness.  - in reviewing his Rx bottles, he has actually only been taking Benazepril 5mg  daily for the past year instead of the prescribed 10mg  daily. Will therefore increase Benazepril from 5mg  daily to 10mg  daily. Creatinine recently checked and at 0.9. - will check his BP at home and report back if readings remain elevated, as he may require further dose titration. Will need a repeat BMET at that time if dose adjustment is required.  2. CAD involving native artery without angina pectoris - s/p DES to LAD in 2009, patent by cath in 2012. EF improved to 55% in 2009 following revascularization.  - denies any recent chest discomfort or dyspnea with exertion. EKG performed today is without acute ischemic changes.  - continue ASA, statin, and ACE-I. Educated on the importance of not taking SL NTG and Viagra within 24 hours of each other.   3. Dyslipidemia - recent Lipid Panel in 06/2016 showed HDL 49, LDL 83, total cholesterol 409152. LFT's within normal limits.  -  continue Atorvastatin 80mg  daily with goal LDL < 70. Encouraged more frequent exercise and diet control to assist with HDL levels.   4. GERD - does report an occasional burning sensation along his sternum which is worse with food consumption. No association with exertion and does not resemble his previous MI in 2009. EKG today is without acute ischemic changes. Symptoms most consistent with GERD. - continue with OTC Omeprazole.  5. Disposition - he will monitor BP readings at home following dose titration and report back if BP remains elevated. He travels out of the country frequently and wishes to follow-up as needed until his next annual visit with Dr. Antoine PocheHochrein.    Medication Adjustments/Labs and Tests Ordered: Current medicines are reviewed at length with the patient today.  Concerns regarding medicines are outlined above.  Medication changes, Labs and Tests ordered today are listed in the Patient Instructions below. Patient Instructions  Medications  Start taking BENAZEPRIL 10 MG one tablet by mouth daily.  Continue will all other medications  Your physician wants you to follow-up in: 12 months with Dr Antoine PocheHOCHREIN.You will receive a reminder letter in the mail two months in advance. If you don't receive a letter, please call our office to schedule the follow-up appointment.  If you need a refill on your cardiac medications before your next appointment, please call your pharmacy.   Lorri FrederickSigned, Gricel Copen M Johncharles Fusselman, PA  08/26/2016 9:52 AM    [facility] [address] [place], [place]  [address] Phone: [phone]; Fax: [phone]) [phone]  [address], [address] [place], [place] [address] Phone: [phone]

## 2016-08-26 NOTE — Patient Instructions (Addendum)
Medications  Start taking BENAZEPRIL 10 MG one tablet by mouth daily.  Continue will all other medications   Your physician wants you to follow-up in: 12 months with Dr Antoine PocheHOCHREIN.You will receive a reminder letter in the mail two months in advance. If you don't receive a letter, please call our office to schedule the follow-up appointment.   If you need a refill on your cardiac medications before your next appointment, please call your pharmacy.

## 2017-08-26 ENCOUNTER — Encounter: Payer: Self-pay | Admitting: Cardiology

## 2017-08-26 ENCOUNTER — Ambulatory Visit (INDEPENDENT_AMBULATORY_CARE_PROVIDER_SITE_OTHER): Payer: BLUE CROSS/BLUE SHIELD | Admitting: Cardiology

## 2017-08-26 VITALS — BP 128/90 | HR 74 | Ht 66.5 in | Wt 207.4 lb

## 2017-08-26 DIAGNOSIS — Z9861 Coronary angioplasty status: Secondary | ICD-10-CM | POA: Diagnosis not present

## 2017-08-26 DIAGNOSIS — I255 Ischemic cardiomyopathy: Secondary | ICD-10-CM

## 2017-08-26 DIAGNOSIS — I10 Essential (primary) hypertension: Secondary | ICD-10-CM | POA: Diagnosis not present

## 2017-08-26 DIAGNOSIS — I2510 Atherosclerotic heart disease of native coronary artery without angina pectoris: Secondary | ICD-10-CM

## 2017-08-26 DIAGNOSIS — E785 Hyperlipidemia, unspecified: Secondary | ICD-10-CM

## 2017-08-26 LAB — COMPREHENSIVE METABOLIC PANEL
ALT: 43 IU/L (ref 0–44)
AST: 21 IU/L (ref 0–40)
Albumin/Globulin Ratio: 1.6 (ref 1.2–2.2)
Albumin: 4.4 g/dL (ref 3.6–4.8)
Alkaline Phosphatase: 94 IU/L (ref 39–117)
BUN/Creatinine Ratio: 11 (ref 10–24)
BUN: 10 mg/dL (ref 8–27)
Bilirubin Total: 0.7 mg/dL (ref 0.0–1.2)
CO2: 26 mmol/L (ref 20–29)
Calcium: 9.5 mg/dL (ref 8.6–10.2)
Chloride: 101 mmol/L (ref 96–106)
Creatinine, Ser: 0.92 mg/dL (ref 0.76–1.27)
GFR calc Af Amer: 101 mL/min/{1.73_m2} (ref >59)
GFR calc non Af Amer: 88 mL/min/{1.73_m2} (ref >59)
Globulin, Total: 2.7 g/dL (ref 1.5–4.5)
Glucose: 105 mg/dL — ABNORMAL HIGH (ref 65–99)
Potassium: 5.2 mmol/L (ref 3.5–5.2)
Sodium: 139 mmol/L (ref 134–144)
Total Protein: 7.1 g/dL (ref 6.0–8.5)

## 2017-08-26 LAB — LIPID PANEL
Chol/HDL Ratio: 2.9 ratio (ref 0.0–5.0)
Cholesterol, Total: 140 mg/dL (ref 100–199)
HDL: 48 mg/dL (ref >39)
LDL Calculated: 77 mg/dL (ref 0–99)
Triglycerides: 76 mg/dL (ref 0–149)
VLDL Cholesterol Cal: 15 mg/dL (ref 5–40)

## 2017-08-26 NOTE — Assessment & Plan Note (Signed)
LAD PCI with DES 2009- Patent at cath 2014. No other significant CAD

## 2017-08-26 NOTE — Assessment & Plan Note (Signed)
Repeat B/P by me 118/84

## 2017-08-26 NOTE — Progress Notes (Signed)
o

## 2017-08-26 NOTE — Progress Notes (Signed)
08/26/2017 Douglas Kelley Husband   [birth year]  [id number]  Primary Physician Elijio MilesWeaver, John W., MD Primary Cardiologist: Dr Antoine PocheHochrein  HPI:  66 y/o male with a history of anterior MI in 2009 treated with LAD DES. He had a cardiomyopathy at the time of his MI but his EF had recovered by echo later that year.  F/U cath in 2012 (through [facility] secondary to access problem on Rt FA) showed patent LAD stent with no other significant CAD and an EF of 51%.  His symptoms then were attributed to GERD. He has done well since.   He is in the office today for follow up. He travels extensively for work. He has not had chest pain. He admits he gets a little SOB when he vigorously exerts himself but he recovers quickly. He walks his dog 3 miles a day without problems. He is tolerating Lipitor.    Current Outpatient Medications  Medication Sig Dispense Refill  . aspirin 81 MG tablet Take 81 mg by mouth daily.      Marland Kitchen. atorvastatin (LIPITOR) 80 MG tablet TAKE 1 TABLET DAILY 90 tablet 3  . benazepril (LOTENSIN) 10 MG tablet Take 1 tablet (10 mg total) by mouth daily. 90 tablet 3  . nitroGLYCERIN (NITROSTAT) 0.4 MG SL tablet Place 1 tablet (0.4 mg total) under the tongue every 5 (five) minutes as needed for chest pain. 25 tablet 4  . VIAGRA 100 MG tablet Take 100 mg by mouth as needed. NOT TAKE WITH NITRATES     No current facility-administered medications for this visit.     Allergies  Allergen Reactions  . Levofloxacin     Past Medical History:  Diagnosis Date  . Coronary artery disease    Anterior wall MI-2009 treated with a drug-eluting stent;  LHC 08/30/11: LM ok, pLAD stent ok with < 30%, OM2 50%, EF 51%  . Dyspepsia   . HLD (hyperlipidemia)   . Ischemic cardiomyopathy    EF 35-40% at ant MI in 2009;  Echo 12/09: mild apical HK, EF 55%, mild MR  . Research study patient    REVEAL    Social History   Socioeconomic History  . Marital status: Married    Spouse name: Not on file  . Number of  children: Not on file  . Years of education: Not on file  . Highest education level: Not on file  Social Needs  . Financial resource strain: Not on file  . Food insecurity - worry: Not on file  . Food insecurity - inability: Not on file  . Transportation needs - medical: Not on file  . Transportation needs - non-medical: Not on file  Occupational History  . Not on file  Tobacco Use  . Smoking status: Never Smoker  . Smokeless tobacco: Never Used  Substance and Sexual Activity  . Alcohol use: Yes  . Drug use: No  . Sexual activity: Not on file  Other Topics Concern  . Not on file  Social History Narrative  . Not on file     Family History  Problem Relation Age of Onset  . Heart attack Mother      Review of Systems: General: negative for chills, fever, night sweats or weight changes.  Cardiovascular: negative for chest pain, dyspnea on exertion, edema, orthopnea, palpitations, paroxysmal nocturnal dyspnea or shortness of breath Dermatological: negative for rash Respiratory: negative for cough or wheezing Urologic: negative for hematuria Abdominal: negative for nausea, vomiting, diarrhea, bright red blood per rectum,  melena, or hematemesis Neurologic: negative for visual changes, syncope, or dizziness All other systems reviewed and are otherwise negative except as noted above.    Blood pressure 128/90, pulse 74, height 5' 6.5" (1.689 m), weight 207 lb 6.4 oz (94.1 kg).  General appearance: alert, cooperative, no distress and mildly obese Neck: no carotid bruit and no JVD Lungs: clear to auscultation bilaterally Heart: regular rate and rhythm Extremities: extremities normal, atraumatic, no cyanosis or edema Pulses: 2+ and symmetric Skin: Skin color, texture, turgor normal. No rashes or lesions Neurologic: Grossly normal  EKG NSR  ASSESSMENT AND PLAN:   CAD S/P percutaneous coronary angioplasty LAD PCI with DES 2009- Patent at cath 2014. No other significant  CAD  Essential hypertension Repeat B/P by me 118/84  Ischemic cardiomyopathy EF initially 35% with MI 2009- recovered by echo later that year No symptoms of CHF  Dyslipidemia Tolerating Lipitor 80 mg- due for lipids   PLAN  Mr Douglas Kelley is doing well from a cardiac standpoint. He has not eaten today so I will go ahead and check a CMET and lipids.   He admits he is a little frustrated that he has not been able to see an MD- his last 3 OV have been with an APP. The last time he has seen a cardiologist was when he had his cath in 2012.  He is also having problems getting in to see his PCP- waiting time for routine physical is several months. I'll note that the pt should see Dr Dewitt RotaHochrein-not an APP at his next yearly visit.    Corine ShelterLuke Andreia Gandolfi PA-C 08/26/2017 8:38 AM

## 2017-08-26 NOTE — Assessment & Plan Note (Signed)
Tolerating Lipitor 80 mg- due for lipids

## 2017-08-26 NOTE — Assessment & Plan Note (Signed)
EF initially 35% with MI 2009- recovered by echo later that year No symptoms of CHF

## 2017-08-26 NOTE — Patient Instructions (Signed)
Medication Instructions: Your physician recommends that you continue on your current medications as directed. Please refer to the Current Medication list given to you today.  If you need a refill on your cardiac medications before your next appointment, please call your pharmacy.   Labwork: Your physician recommends that you have the following done today: CMET and Fasting Lipid   Follow-Up: Your physician wants you to follow-up in: 12 months with Dr. Antoine PocheHochrein. You will receive a reminder letter in the mail two months in advance. If you don't receive a letter, please call our office at [phone] to schedule this follow-up appointment.   Thank you for choosing [facility]!!

## 2017-09-02 ENCOUNTER — Encounter: Payer: Self-pay | Admitting: *Deleted

## 2017-11-16 ENCOUNTER — Other Ambulatory Visit: Payer: Self-pay | Admitting: Physician Assistant

## 2017-11-17 NOTE — Telephone Encounter (Signed)
REFILL 

## 2018-08-28 ENCOUNTER — Ambulatory Visit: Payer: BLUE CROSS/BLUE SHIELD | Admitting: Cardiology

## 2018-09-10 NOTE — Progress Notes (Addendum)
Cardiology Office Note   Date:  09/14/2018   ID:  Douglas Kelley, DOB 22-Jun-1951, MRN [id number]  PCP:  Elijio Miles., MD  Cardiologist:   No primary care provider on file.   Chief Complaint  Patient presents with  . Coronary Artery Disease      History of Present Illness: Douglas Kelley is a 67 y.o. male who presents for follow up of CAD.  He had a history of 67 y/o male with a history of anterior MI in 2009 treated with LAD DES. He had a cardiomyopathy at the time of his MI but his EF had recovered by echo later that year.  F/U cath in 2012 demonstrated a patent LAD stent with no other significant CAD and an EF of 51%.   It has been several years since I have seen him although he was seen in our clinic last year.      He said he does very well.  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 The patient denies any new symptoms such as chest discomfort, neck or arm discomfort. There has been no new shortness of breath, PND or orthopnea. There have been no reported palpitations, presyncope or syncope.  He walks his dog 3 miles a day.     Past Medical History:  Diagnosis Date  . Coronary artery disease    Anterior wall MI-2009 treated with a drug-eluting stent;  LHC 08/30/11: LM ok, pLAD stent ok with < 30%, OM2 50%, EF 51%  . Dyspepsia   . HLD (hyperlipidemia)   . Ischemic cardiomyopathy    EF 35-40% at ant MI in 2009;  Echo 12/09: mild apical HK, EF 55%, mild MR  . Research study patient    REVEAL    Past Surgical History:  Procedure Laterality Date  . INGUINAL HERNIA REPAIR    . UMBILICAL HERNIA REPAIR       Current Outpatient Medications  Medication Sig Dispense Refill  . aspirin 81 MG tablet Take 81 mg by mouth daily.      Marland Kitchen atorvastatin (LIPITOR) 80 MG tablet TAKE 1 TABLET DAILY 90 tablet 3  . ezetimibe (ZETIA) 10 MG tablet Take 1 tablet by mouth daily.    . nitroGLYCERIN (NITROSTAT) 0.4 MG SL tablet Place 1 tablet (0.4 mg total) under the  tongue every 5 (five) minutes as needed for chest pain. 25 tablet 4  . tamsulosin (FLOMAX) 0.4 MG CAPS capsule Take 1 capsule by mouth daily.    Marland Kitchen VIAGRA 100 MG tablet Take 100 mg by mouth as needed. NOT TAKE WITH NITRATES    . losartan (COZAAR) 25 MG tablet Take 1 tablet (25 mg total) by mouth daily. 90 tablet 3   No current facility-administered medications for this visit.     Allergies:   Levofloxacin     ROS:  Please see the history of present illness.   Otherwise, review of systems are positive for cough.   All other systems are reviewed and negative.    PHYSICAL EXAM: VS:  BP 126/86   Pulse 87   Ht 5' 6.5" (1.689 m)   Wt 216 lb (98 kg)   BMI 34.34 kg/m  , BMI Body mass index is 34.34 kg/m. GENERAL:  Well appearing HEENT:  Pupils equal round and reactive, fundi not visualized, oral mucosa unremarkable NECK:  No jugular venous distention, waveform within normal limits, carotid upstroke brisk and symmetric, no bruits, no thyromegaly LYMPHATICS:  No cervical, inguinal adenopathy LUNGS:  Clear to auscultation bilaterally BACK:  No CVA tenderness CHEST:  Unremarkable HEART:  PMI not displaced or sustained,S1 and S2 within normal limits, no S3, no S4, no clicks, no rubs, no murmurs ABD:  Flat, positive bowel sounds normal in frequency in pitch, no bruits, no rebound, no guarding, no midline pulsatile mass, no hepatomegaly, no splenomegaly EXT:  2 plus pulses throughout, no edema, no cyanosis no clubbing SKIN:  No rashes no nodules NEURO:  Cranial nerves II through XII grossly intact, motor grossly intact throughout PSYCH:  Cognitively intact, oriented to person place and time    EKG:  EKG is ordered today. The ekg ordered today demonstrates sinus rhythm, rate 87, axis within normal limits, intervals within normal limits, poor anterior R wave progression, old anteroseptal infarct.  Recent Labs: No results found for requested labs within last 8760 hours.    Lipid Panel      Component Value Date/Time   CHOL 140 08/26/2017 0844   TRIG 76 08/26/2017 0844   HDL 48 08/26/2017 0844   CHOLHDL 2.9 08/26/2017 0844   CHOLHDL 3 01/03/2009 0953   VLDL 12.2 01/03/2009 0953   LDLCALC 77 08/26/2017 0844      Wt Readings from Last 3 Encounters:  09/14/18 216 lb (98 kg)  08/26/17 207 lb 6.4 oz (94.1 kg)  08/26/16 206 lb (93.4 kg)      Other studies Reviewed: Additional studies/ records that were reviewed today include: Labs. Review of the above records demonstrates:  Please see elsewhere in the note.     ASSESSMENT AND PLAN:  CAD:   Patient has no symptoms.  No further cardiovascular testing is suggested.  He is a high functional level and is very active.  Essential hypertension Blood pressure is at target.  No change in therapy.  Ischemic cardiomyopathy His initial ejection fraction after the MI was 35% but returned to near normal a few months later.  I would not suspect reduced ejection fraction.  No change in therapy is indicated.   Dyslipidemia His LDL recently was 77.  He just started Zetia in addition to his high-dose statin and I agree with this.  Cough This may be related to his ACE inhibitor as it seems to be chronic nonproductive.  I am to switch him to Cozaar 25 mg daily.   Current medicines are reviewed at length with the patient today.  The patient does not have concerns regarding medicines.  The following changes have been made:  no change  Labs/ tests ordered today include: None  Orders Placed This Encounter  Procedures  . EKG 12-Lead     Disposition:   FU with me in one year.     Signed, Rollene RotundaJames Daylen Hack, MD  09/14/2018 11:33 AM    [facility]

## 2018-09-14 ENCOUNTER — Ambulatory Visit: Payer: BLUE CROSS/BLUE SHIELD | Admitting: Cardiology

## 2018-09-14 ENCOUNTER — Encounter: Payer: Self-pay | Admitting: Cardiology

## 2018-09-14 VITALS — BP 126/86 | HR 87 | Ht 66.5 in | Wt 216.0 lb

## 2018-09-14 DIAGNOSIS — E785 Hyperlipidemia, unspecified: Secondary | ICD-10-CM

## 2018-09-14 DIAGNOSIS — I2510 Atherosclerotic heart disease of native coronary artery without angina pectoris: Secondary | ICD-10-CM

## 2018-09-14 DIAGNOSIS — R05 Cough: Secondary | ICD-10-CM

## 2018-09-14 DIAGNOSIS — I10 Essential (primary) hypertension: Secondary | ICD-10-CM

## 2018-09-14 DIAGNOSIS — R059 Cough, unspecified: Secondary | ICD-10-CM

## 2018-09-14 MED ORDER — LOSARTAN POTASSIUM 25 MG PO TABS: 25.0000 mg | ORAL_TABLET | Freq: Every day | ORAL | 3 refills | Status: DC

## 2018-09-14 NOTE — Patient Instructions (Addendum)
Medication Instructions:  STOP- Benazepril START- Losartan 25 mg daily  If you need a refill on your cardiac medications before your next appointment, please call your pharmacy.  Labwork: None Ordered   If you have labs (blood work) drawn today and your tests are completely normal, you will receive your results only by: Marland Kitchen. MyChart Message (if you have MyChart) OR . A paper copy in the mail If you have any lab test that is abnormal or we need to change your treatment, we will call you to review the results.  Testing/Procedures: None Ordered  Follow-Up: You will need a follow up appointment in 1 Year.  Please call our office 2 months in [phone]) to schedule the (1 Year) appointment.  You may see  DR Antoine PocheHochrein, or one of the following Advanced Practice Providers on your designated Care Team:    . Theodore DemarkRhonda Barrett, PA-C  -OR- Joni ReiningKathryn Lawrence, DNP, ANP  . Corine ShelterLuke Kilroy, [place]PA-C  . Azalee CourseHao Meng, PA-C -OR- Micah FlesherAngela Duke, PA-C  At [facility], you and your health needs are our priority.  As part of our continuing mission to provide you with exceptional heart care, we have created designated Provider Care Teams.  These Care Teams include your primary Cardiologist (physician) and Advanced Practice Providers (APPs -  Physician Assistants and Nurse Practitioners) who all work together to provide you with the care you need, when you need it.   Thank you for choosing [facility] at [facility]!!

## 2018-12-28 ENCOUNTER — Other Ambulatory Visit: Payer: Self-pay | Admitting: Physician Assistant

## 2019-10-12 DIAGNOSIS — Z7189 Other specified counseling: Secondary | ICD-10-CM | POA: Insufficient documentation

## 2019-10-12 NOTE — Progress Notes (Deleted)
Cardiology Office Note   Date:  10/12/2019   ID:  Douglas Kelley, DOB 05-09-1951, MRN [id number]  PCP:  Derrill Center., MD  Cardiologist:   No primary care provider on file.   No chief complaint on file.     History of Present Illness: Douglas Kelley is a 69 y.o. male who presents for follow up of CAD.  He had a history of 69 y/o male with a history of anterior MI in 2009 treated with LAD DES. He had a cardiomyopathy at the time of his MI but his EF had recovered by echo later that year.  F/U cath in 2012 demonstrated a patent LAD stent with no other significant CAD and an EF of 51%.  Last year I switched him from ACE to ARB because of cough.   ***  ***  ***  It has been several years since I have seen him although he was seen in our clinic last year.      He said he does very well.  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 The patient denies any new symptoms such as chest discomfort, neck or arm discomfort. There has been no new shortness of breath, PND or orthopnea. There have been no reported palpitations, presyncope or syncope.  He walks his dog 3 miles a day.     Past Medical History:  Diagnosis Date  . Coronary artery disease    Anterior wall MI-2009 treated with a drug-eluting stent;  LHC 08/30/11: LM ok, pLAD stent ok with < 30%, OM2 50%, EF 51%  . Dyspepsia   . HLD (hyperlipidemia)   . Ischemic cardiomyopathy    EF 35-40% at ant MI in 2009;  Echo 12/09: mild apical HK, EF 55%, mild MR  . Research study patient    REVEAL    Past Surgical History:  Procedure Laterality Date  . INGUINAL HERNIA REPAIR    . UMBILICAL HERNIA REPAIR       Current Outpatient Medications  Medication Sig Dispense Refill  . aspirin 81 MG tablet Take 81 mg by mouth daily.      Marland Kitchen atorvastatin (LIPITOR) 80 MG tablet TAKE 1 TABLET DAILY 90 tablet 3  . ezetimibe (ZETIA) 10 MG tablet Take 1 tablet by mouth daily.    Marland Kitchen losartan (COZAAR) 25 MG tablet Take 1 tablet (25  mg total) by mouth daily. 90 tablet 3  . nitroGLYCERIN (NITROSTAT) 0.4 MG SL tablet Place 1 tablet (0.4 mg total) under the tongue every 5 (five) minutes as needed for chest pain. 25 tablet 4  . tamsulosin (FLOMAX) 0.4 MG CAPS capsule Take 1 capsule by mouth daily.    Marland Kitchen VIAGRA 100 MG tablet Take 100 mg by mouth as needed. NOT TAKE WITH NITRATES     No current facility-administered medications for this visit.    Allergies:   Levofloxacin     ROS:  Please see the history of present illness.   Otherwise, review of systems are positive for ***.   All other systems are reviewed and negative.    PHYSICAL EXAM: VS:  There were no vitals taken for this visit. , BMI There is no height or weight on file to calculate BMI. GENERAL:  Well appearing NECK:  No jugular venous distention, waveform within normal limits, carotid upstroke brisk and symmetric, no bruits, no thyromegaly LUNGS:  Clear to auscultation bilaterally CHEST:  Unremarkable HEART:  PMI not displaced or sustained,S1  and S2 within normal limits, no S3, no S4, no clicks, no rubs, *** murmurs ABD:  Flat, positive bowel sounds normal in frequency in pitch, no bruits, no rebound, no guarding, no midline pulsatile mass, no hepatomegaly, no splenomegaly EXT:  2 plus pulses throughout, no edema, no cyanosis no clubbing     ***GENERAL:  Well appearing HEENT:  Pupils equal round and reactive, fundi not visualized, oral mucosa unremarkable NECK:  No jugular venous distention, waveform within normal limits, carotid upstroke brisk and symmetric, no bruits, no thyromegaly LYMPHATICS:  No cervical, inguinal adenopathy LUNGS:  Clear to auscultation bilaterally BACK:  No CVA tenderness CHEST:  Unremarkable HEART:  PMI not displaced or sustained,S1 and S2 within normal limits, no S3, no S4, no clicks, no rubs, no murmurs ABD:  Flat, positive bowel sounds normal in frequency in pitch, no bruits, no rebound, no guarding, no midline pulsatile mass,  no hepatomegaly, no splenomegaly EXT:  2 plus pulses throughout, no edema, no cyanosis no clubbing SKIN:  No rashes no nodules NEURO:  Cranial nerves II through XII grossly intact, motor grossly intact throughout PSYCH:  Cognitively intact, oriented to person place and time    EKG:  EKG is *** ordered today. The ekg ordered today demonstrates sinus rhythm, rate ***, axis within normal limits, intervals within normal limits, poor anterior R wave progression, old anteroseptal infarct.  Recent Labs: No results found for requested labs within last 8760 hours.     Wt Readings from Last 3 Encounters:  09/14/18 216 lb (98 kg)  08/26/17 207 lb 6.4 oz (94.1 kg)  08/26/16 206 lb (93.4 kg)      Other studies Reviewed: Additional studies/ records that were reviewed today include:  *** Review of the above records demonstrates:  Please see elsewhere in the note.     ASSESSMENT AND PLAN:  CAD:   *** Patient has no symptoms.  No further cardiovascular testing is suggested.  He is a high functional level and is very active.  Essential hypertension Blood pressure is *** at target.  No change in therapy.  Ischemic cardiomyopathy His initial ejection fraction after the MI was 35% but was 55% when last measured years ago.  *** but returned to near normal a few months later.  I would not suspect reduced ejection fraction.  No change in therapy is indicated.   Dyslipidemia His LDL *** recently was 77.  He just started Zetia in addition to his high-dose statin and I agree with this.  Cough ***  This may be related to his ACE inhibitor as it seems to be chronic nonproductive.  I am to switch him to Cozaar 25 mg daily.  Covid Education ***   Current medicines are reviewed at length with the patient today.  The patient does not have concerns regarding medicines.  The following changes have been made:  ***  Labs/ tests ordered today include: ***  No orders of the defined types were  placed in this encounter.    Disposition:   FU with me in *** year.     Signed, Rollene Rotunda, MD  10/12/2019 11:26 AM    [facility]

## 2019-10-14 ENCOUNTER — Encounter: Payer: Self-pay | Admitting: Cardiology

## 2019-10-14 ENCOUNTER — Ambulatory Visit: Payer: BLUE CROSS/BLUE SHIELD | Admitting: Cardiology

## 2019-10-20 NOTE — Progress Notes (Signed)
Cardiology Office Note   Date:  10/21/2019   ID:  Douglas Kelley, DOB 02-08-1951, MRN [id number]  PCP:  Derrill Center., MD  Cardiologist:   Minus Breeding, MD   Chief Complaint  Patient presents with  . Coronary Artery Disease      History of Present Illness: Douglas Kelley is a 69 y.o. male who presents for follow up of CAD.  He had a history of 69 y/o male with a history of anterior MI in 2009 treated with LAD DES. He had a cardiomyopathy at the time of his MI but his EF had recovered by echo later that year.  F/U cath in 2012 demonstrated a patent LAD stent with no other significant CAD and an EF of 51%.  Last year I switched him from ACE to ARB because of cough.     He did better with the Cozaar.  However, his blood pressure is slightly elevated.  He has not been scuba diving which is his passion.  He has not been traveling around the world to scuba dive which is the other part of his passions.  He has been doing his yard work and getting his heart rate up. The patient denies any new symptoms such as chest discomfort, neck or arm discomfort. There has been no new shortness of breath, PND or orthopnea. There have been no reported palpitations, presyncope or syncope.  He says he is physically active and feels good.  He does still walk his dog 3 miles a day.  Past Medical History:  Diagnosis Date  . Coronary artery disease    Anterior wall MI-2009 treated with a drug-eluting stent;  LHC 08/30/11: LM ok, pLAD stent ok with < 30%, OM2 50%, EF 51%  . Dyspepsia   . HLD (hyperlipidemia)   . Ischemic cardiomyopathy    EF 35-40% at ant MI in 2009;  Echo 12/09: mild apical HK, EF 55%, mild MR  . Research study patient    REVEAL    Past Surgical History:  Procedure Laterality Date  . INGUINAL HERNIA REPAIR    . UMBILICAL HERNIA REPAIR       Current Outpatient Medications  Medication Sig Dispense Refill  . aspirin 81 MG tablet Take 81 mg by mouth daily.      Marland Kitchen atorvastatin (LIPITOR)  80 MG tablet TAKE 1 TABLET DAILY 90 tablet 3  . ezetimibe (ZETIA) 10 MG tablet Take 1 tablet by mouth daily.    Marland Kitchen losartan (COZAAR) 50 MG tablet Take 1 tablet (50 mg total) by mouth daily. 90 tablet 3  . nitroGLYCERIN (NITROSTAT) 0.4 MG SL tablet Place 1 tablet (0.4 mg total) under the tongue every 5 (five) minutes as needed for chest pain. 25 tablet 4  . VIAGRA 100 MG tablet Take 100 mg by mouth as needed. NOT TAKE WITH NITRATES    . tamsulosin (FLOMAX) 0.4 MG CAPS capsule Take 1 capsule by mouth daily.     No current facility-administered medications for this visit.    Allergies:   Ace inhibitors and Levofloxacin     ROS:  Please see the history of present illness.   Otherwise, review of systems are positive for none.   All other systems are reviewed and negative.    PHYSICAL EXAM: VS:  BP (!) 140/96   Pulse 84   Temp (!) 97 F (36.1 C)   Ht 5\' 7"  (1.702 m)   Wt 207 lb (93.9 kg)   SpO2 95%  BMI 32.42 kg/m  , BMI Body mass index is 32.42 kg/m. GENERAL:  Well appearing NECK:  No jugular venous distention, waveform within normal limits, carotid upstroke brisk and symmetric, no bruits, no thyromegaly LUNGS:  Clear to auscultation bilaterally CHEST:  Unremarkable HEART:  PMI not displaced or sustained,S1 and S2 within normal limits, no S3, no S4, no clicks, no rubs, no murmurs ABD:  Flat, positive bowel sounds normal in frequency in pitch, no bruits, no rebound, no guarding, no midline pulsatile mass, no hepatomegaly, no splenomegaly EXT:  2 plus pulses throughout, no edema, no cyanosis no clubbing   EKG:  EKG is  ordered today. The ekg ordered today demonstrates sinus rhythm, rate 89, axis within normal limits, intervals within normal limits, poor anterior R wave progression, old anteroseptal infarct.  Recent Labs: No results found for requested labs within last 8760 hours.     Wt Readings from Last 3 Encounters:  10/21/19 207 lb (93.9 kg)  09/14/18 216 lb (98 kg)    08/26/17 207 lb 6.4 oz (94.1 kg)      Other studies Reviewed: Additional studies/ records that were reviewed today include:  None Review of the above records demonstrates:  Please see elsewhere in the note.     ASSESSMENT AND PLAN:  CAD:   The patient has no new sypmtoms.  No further cardiovascular testing is indicated.  We will continue with aggressive risk reduction and meds as listed.  Essential hypertension Blood pressure is elevated.  I am going to increase his Cozaar to 50 mg daily.   Ischemic cardiomyopathy His initial ejection fraction after the MI was 35% but was 55% when last measured years ago.  No change in therapy or further imaging.   Dyslipidemia His LDL he reports was "low".  I don't have these readings but I will try to get them from the PCP office.   Cough His cough was likely related to ACE inhibitor's.  We will add this as an intolerance.  He will stay on the meds with the adjustment as above.   Covid Education We talked about the vaccine and he wants to have it.  We gave him some information.   Current medicines are reviewed at length with the patient today.  The patient does not have concerns regarding medicines.  The following changes have been made:  As above  Labs/ tests ordered today include:   Orders Placed This Encounter  Procedures  . EKG 12-Lead     Disposition:   FU with me in one year.     Signed, Rollene Rotunda, MD  10/21/2019 5:52 PM    [facility]

## 2019-10-21 ENCOUNTER — Other Ambulatory Visit: Payer: Self-pay

## 2019-10-21 ENCOUNTER — Encounter: Payer: Self-pay | Admitting: Cardiology

## 2019-10-21 ENCOUNTER — Ambulatory Visit (INDEPENDENT_AMBULATORY_CARE_PROVIDER_SITE_OTHER): Payer: Medicare Other | Admitting: Cardiology

## 2019-10-21 VITALS — BP 140/96 | HR 84 | Temp 97.0°F | Ht 67.0 in | Wt 207.0 lb

## 2019-10-21 DIAGNOSIS — I255 Ischemic cardiomyopathy: Secondary | ICD-10-CM | POA: Diagnosis not present

## 2019-10-21 DIAGNOSIS — I2510 Atherosclerotic heart disease of native coronary artery without angina pectoris: Secondary | ICD-10-CM | POA: Diagnosis not present

## 2019-10-21 DIAGNOSIS — Z7189 Other specified counseling: Secondary | ICD-10-CM

## 2019-10-21 DIAGNOSIS — E785 Hyperlipidemia, unspecified: Secondary | ICD-10-CM

## 2019-10-21 DIAGNOSIS — I10 Essential (primary) hypertension: Secondary | ICD-10-CM | POA: Diagnosis not present

## 2019-10-21 MED ORDER — LOSARTAN POTASSIUM 50 MG PO TABS: 50.0000 mg | ORAL_TABLET | Freq: Every day | ORAL | 3 refills | Status: DC

## 2019-10-21 NOTE — Patient Instructions (Addendum)
Medication Instructions:  INCREASE COZAAR(LOSARTAN) TO 50MG  DAILY *If you need a refill on your cardiac medications before your next appointment, please call your pharmacy*  Lab Work: NONE  Testing/Procedures: NONE  Follow-Up: At , you and your health needs are our priority.  As part of our continuing mission to provide you with exceptional heart care, we have created designated Provider Care Teams.  These Care Teams include your primary Cardiologist (physician) and Advanced Practice Providers (APPs -  Physician Assistants and Nurse Practitioners) who all work together to provide you with the care you need, when you need it.  Your next appointment:   1 year(s)  You will receive a reminder letter in the mail two months in advance. If you don't receive a letter, please call our office to schedule the follow-up appointment.   The format for your next appointment:   In Person  Provider:   [employer], MD  Other Instructions CALL [phone] TO SCHEDULE YOUR COVID VACCINE. CALL DAILY TO SEE IF ANY NEW SPOTS HAVE OPENED. THEY GIVE THE VACCINE AT:  [facility], [address], [place], [place]  [facility], [address], [address], [place], [place]  [facility] [address] [place], [place]

## 2019-11-04 ENCOUNTER — Telehealth: Payer: Self-pay | Admitting: Cardiology

## 2019-11-04 NOTE — Telephone Encounter (Signed)
I would like to see his BPs twice daily for the next two weeks.

## 2019-11-04 NOTE — Telephone Encounter (Signed)
Spoke with pt who states his losartan dosage was increased to 50 mg by mouth daily but he is still having issues with elevated BP. Pt confirmed losartan increased from 25 mg to 50 mg on 10/21/2019 and states he started taking 50 mg daily the following day. He states he felt great up until this morning when he woke up with a bad headache, which can be a sign of elevated BP for him. Pt checked his BP and reading was 160/100. Questioned if pt had BP reading from yesterday for comparison. Pt states he does not nor does he check his BP regularly. Pt states he notices BP higher in the mornings though. Advised pt to check BP at least once daily around the same time each day. Informed pt that medication increase may take some time to take full effect, but encounter to be routed to Dr. Antoine Poche to advise

## 2019-11-04 NOTE — Telephone Encounter (Signed)
  Pt c/o medication issue:  1. Name of Medication: losartan (COZAAR) 50 MG tablet  2. How are you currently taking this medication (dosage and times per day)? Once a day   3. Are you having a reaction (difficulty breathing--STAT)? no  4. What is your medication issue? Patient states his medication was increased, because his BP was high, but it has not helped. He says his BP is still high and was 160/100 this morning. Please advise.

## 2019-11-04 NOTE — Telephone Encounter (Signed)
Contacted pt and made him aware of the following from Dr. Antoine Poche:  "I would like to see his BPs twice daily for the next two weeks."  Pt verbalized understanding.

## 2019-11-22 ENCOUNTER — Other Ambulatory Visit: Payer: Self-pay

## 2019-11-22 MED ORDER — LOSARTAN POTASSIUM 100 MG PO TABS: 100.0000 mg | ORAL_TABLET | Freq: Every day | ORAL | 3 refills | Status: DC

## 2019-12-07 ENCOUNTER — Telehealth: Payer: Self-pay | Admitting: Cardiology

## 2019-12-07 NOTE — Telephone Encounter (Signed)
Spoke with pt, he has been on the increased dose for about 1 week now and there has been no change since increasing the losartan to 100 mg once daily. He also reports feeling very tired since the increase. He was encouraged to try tylenol for the headache. Will forward to dr hochrein to review and advise.

## 2019-12-07 NOTE — Telephone Encounter (Signed)
He can go back to Cozaar 50 mg daily and I would suggest starting Norvasc 2.5 mg PO daily.   He needs to schedule follow up to discuss these changes and ongoing symptoms.  This can be with an APP.

## 2019-12-07 NOTE — Telephone Encounter (Signed)
New Message   Pt c/o BP issue:  1. What are your last 5 BP readings? 169/110, 157/105, 149/105 2. Are you having any other symptoms (ex. Dizziness, headache, blurred vision, passed out)? headaches 3. What is your medication issue? Patient states that although his medication has been pushed up he is not having any improvements

## 2019-12-08 MED ORDER — LOSARTAN POTASSIUM 50 MG PO TABS: 100.0000 mg | ORAL_TABLET | Freq: Every day | ORAL | 3 refills | Status: DC

## 2019-12-08 MED ORDER — AMLODIPINE BESYLATE 2.5 MG PO TABS: 2.5000 mg | ORAL_TABLET | Freq: Every day | ORAL | 3 refills | Status: DC

## 2019-12-08 NOTE — Telephone Encounter (Signed)
Spoke with patient. Patient's Cozaar decreased to 50mg  daily and Norvasc 2.5mg  daily added. Prescription sent to pharmacy of choice.   Patient scheduled for 12/13/19 with 02/12/20 at 8:15am to discuss medication changes and ongoing symptoms.

## 2019-12-09 NOTE — Progress Notes (Signed)
Virtual Visit via Video Note   This visit type was conducted due to national recommendations for restrictions regarding the COVID-19 Pandemic (e.g. social distancing) in an effort to limit this patient's exposure and mitigate transmission in our community.  Due to his co-morbid illnesses, this patient is at least at moderate risk for complications without adequate follow up.  This format is felt to be most appropriate for this patient at this time.  All issues noted in this document were discussed and addressed.  A limited physical exam was performed with this format.  Please refer to the patient's chart for his consent to telehealth for [facility].   Date:  12/13/2019   ID:  Douglas Kelley, DOB 10-Feb-1951, MRN [id number]  Patient Location: Home Provider Location: Office  PCP:  Elijio Miles., MD  Cardiologist:  Rollene Rotunda, MD  Electrophysiologist:  None    Chief Complaint:  Hypertension   History of Present Illness:    Douglas Kelley is a 69 y.o. male we are following for ongoing assessment and management of CAD. 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 Follow up cardiac cath in 2012 revealed patent LAD sent without new or significant CAD. EF of 51%.  He is intolerant to ACE inhibitors due to cough but can tolerate ARB's.   When last seen by Dr. Antoine Poche on 10/21/2019 he was hypertensive. Cozaar dose was increased to 50 mg daily at that time. Since then several phone calls from [facility] reporting elevated BP. Some recordings up to 160/100. He was asked to take his BP BID for two weeks and report his readings. These recordings were called to our office on 12/07/2019. They ranged from 169/110 to 149/015.  He was told to start continue Cozaar 50 mg daily and add amlodipine 2.5 mg daily. He was to follow up with me for evaluation of his response to medications.   He states that at first, his BP did go down with changes in medication, but lately it has gone back up. He reports that his  stamina is getting lower, compared ot 6 months ago.  He has a dog that he walks but is often tired when he does so. He admits to eating out a lot as he lives alone and does not do a lot of cooking. He is not limiting salt.   The patient  have symptoms concerning for COVID-19 infection (fever, chills, cough, or new shortness of breath).    Past Medical History:  Diagnosis Date  . Coronary artery disease    Anterior wall MI-2009 treated with a drug-eluting stent;  LHC 08/30/11: LM ok, pLAD stent ok with < 30%, OM2 50%, EF 51%  . Dyspepsia   . HLD (hyperlipidemia)   . Ischemic cardiomyopathy    EF 35-40% at ant MI in 2009;  Echo 12/09: mild apical HK, EF 55%, mild MR  . Research study patient    REVEAL   Past Surgical History:  Procedure Laterality Date  . INGUINAL HERNIA REPAIR    . UMBILICAL HERNIA REPAIR       Current Meds  Medication Sig  . amLODipine (NORVASC) 2.5 MG tablet Take 1 tablet (2.5 mg total) by mouth daily.  Marland Kitchen aspirin 81 MG tablet Take 81 mg by mouth daily.    Marland Kitchen atorvastatin (LIPITOR) 80 MG tablet TAKE 1 TABLET DAILY  . ezetimibe (ZETIA) 10 MG tablet Take 1 tablet by mouth daily.  Marland Kitchen losartan (COZAAR) 50 MG tablet Take 2 tablets (  100 mg total) by mouth daily.  . nitroGLYCERIN (NITROSTAT) 0.4 MG SL tablet Place 1 tablet (0.4 mg total) under the tongue every 5 (five) minutes as needed for chest pain.  Marland Kitchen omeprazole (PRILOSEC) 40 MG capsule Take 1 capsule by mouth daily.  Marland Kitchen VIAGRA 100 MG tablet Take 100 mg by mouth as needed. NOT TAKE WITH NITRATES     Allergies:   Ace inhibitors and Levofloxacin   Social History   Tobacco Use  . Smoking status: Never Smoker  . Smokeless tobacco: Never Used  Substance Use Topics  . Alcohol use: Yes  . Drug use: No     Family Hx: The patient's family history is not on file.  ROS:   Please see the history of present illness.    All other systems reviewed and are negative.   Prior CV studies:   The following studies were  reviewed today:   None available.  Labs/Other Tests and Data Reviewed:    EKG:  No ECG reviewed.  Recent Labs: No results found for requested labs within last 8760 hours.   Recent Lipid Panel Lab Results  Component Value Date/Time   CHOL 140 08/26/2017 08:44 AM   TRIG 76 08/26/2017 08:44 AM   HDL 48 08/26/2017 08:44 AM   CHOLHDL 2.9 08/26/2017 08:44 AM   CHOLHDL 3 01/03/2009 09:53 AM   LDLCALC 77 08/26/2017 08:44 AM    Wt Readings from Last 3 Encounters:  12/13/19 208 lb (94.3 kg)  10/21/19 207 lb (93.9 kg)  09/14/18 216 lb (98 kg)     Objective:    Vital Signs:  BP (!) 159/109   Pulse 76   Ht 5\' 7"  (1.702 m)   Wt 208 lb (94.3 kg)   BMI 32.58 kg/m    VITAL SIGNS:  reviewed GEN:  no acute distress RESPIRATORY:  normal respiratory effort, symmetric expansion CARDIOVASCULAR:  no peripheral edema MUSCULOSKELETAL:  no obvious deformities. NEURO:  alert and oriented x 3, no obvious focal deficit PSYCH:  normal affect  ASSESSMENT & PLAN:    1. CAD:  Known history of MI in 2019 with DES to the LAD, cath in 2012 revealed patent LAD stent without new significant CAD.  He is having more fatigue. He thinks it is because he is not active, however, when he walks his dog he becomes tired as well.  No complaints of chest pain, but some shortness of breath.    Due to his hx and no recent testing, I will schedule a NM Exercise Stress test to rule out areas of new ischemia with known CAD.   2. Hypertension: BP is still not controlled. He admits to eating out a lot and not avoiding salt.  I will add chlorthalidone 25 mg daily to his regimen, especially in light of diastolic pressure elevations. I will check an echocardiogram to evaluate for changes in his LV function.. BMET. Can consider OSA work up if BP is still resistant to medications with body habitus.   3. Hyperlipidemia: Goal of LDL < 70. I will check fasting lipids and LFT's when he comes for his stress test.    4.Obesity:  Multifactorial in the setting of sedentary lifestyle and eating out, along with some exercise intolerance.  Will do cardiac testing. He will need to increase his activity and eat more healthy food choices.   COVID-19 Education: The signs and symptoms of COVID-19 were discussed with the patient and how to seek care for testing (follow up with PCP  or arrange E-visit).  The importance of social distancing was discussed today.  Time:   Today, I have spent 15  minutes with the patient with telehealth technology discussing the above problems.     Medication Adjustments/Labs and Tests Ordered: Current medicines are reviewed at length with the patient today.  Concerns regarding medicines are outlined above.   Tests Ordered: Orders Placed This Encounter  Procedures  . Basic Metabolic Panel (BMET)  . Lipid panel  . Hepatic function panel  . MYOCARDIAL PERFUSION IMAGING  . ECHOCARDIOGRAM COMPLETE    Medication Changes: Meds ordered this encounter  Medications  . chlorthalidone (HYGROTON) 25 MG tablet    Sig: Take 1 tablet (25 mg total) by mouth daily.    Dispense:  90 tablet    Refill:  3    Disposition:  Follow up one month   Signed, Bettey Mare. Liborio Nixon, ANP, AACC  12/13/2019 8:58 AM    [facility]

## 2019-12-13 ENCOUNTER — Encounter: Payer: Self-pay | Admitting: Adult Health

## 2019-12-13 ENCOUNTER — Telehealth (INDEPENDENT_AMBULATORY_CARE_PROVIDER_SITE_OTHER): Payer: Medicare Other | Admitting: Adult Health

## 2019-12-13 VITALS — BP 159/109 | HR 76 | Ht 67.0 in | Wt 208.0 lb

## 2019-12-13 DIAGNOSIS — E785 Hyperlipidemia, unspecified: Secondary | ICD-10-CM | POA: Diagnosis not present

## 2019-12-13 DIAGNOSIS — I2510 Atherosclerotic heart disease of native coronary artery without angina pectoris: Secondary | ICD-10-CM

## 2019-12-13 DIAGNOSIS — I10 Essential (primary) hypertension: Secondary | ICD-10-CM

## 2019-12-13 DIAGNOSIS — E669 Obesity, unspecified: Secondary | ICD-10-CM

## 2019-12-13 DIAGNOSIS — Z6832 Body mass index (BMI) 32.0-32.9, adult: Secondary | ICD-10-CM

## 2019-12-13 DIAGNOSIS — Z7982 Long term (current) use of aspirin: Secondary | ICD-10-CM

## 2019-12-13 MED ORDER — CHLORTHALIDONE 25 MG PO TABS: 25.0000 mg | ORAL_TABLET | Freq: Every day | ORAL | 3 refills | Status: DC

## 2019-12-13 NOTE — Patient Instructions (Signed)
Medication Instructions:  START- Chlorthalidone 25 mg by mouth daily  *If you need a refill on your cardiac medications before your next appointment, please call your pharmacy*   Lab Work: BMP, Fasting Lipid and Liver  If you have labs (blood work) drawn today and your tests are completely normal, you will receive your results only by: Marland Kitchen MyChart Message (if you have MyChart) OR . A paper copy in the mail If you have any lab test that is abnormal or we need to change your treatment, we will call you to review the results.   Testing/Procedures: Your physician has requested that you have an echocardiogram. Echocardiography is a painless test that uses sound waves to create images of your heart. It provides your doctor with information about the size and shape of your heart and how well your heart's chambers and valves are working. This procedure takes approximately one hour. There are no restrictions for this procedure.  Your physician has requested that you have a lexiscan myoview. For further information please visit [email or web address] Please follow instruction sheet, as given.  Follow-Up: At [facility], you and your health needs are our priority.  As part of our continuing mission to provide you with exceptional heart care, we have created designated Provider Care Teams.  These Care Teams include your primary Cardiologist (physician) and Advanced Practice Providers (APPs -  Physician Assistants and Nurse Practitioners) who all work together to provide you with the care you need, when you need it.  We recommend signing up for the patient portal called "MyChart".  Sign up information is provided on this After Visit Summary.  MyChart is used to connect with patients for Virtual Visits (Telemedicine).  Patients are able to view lab/test results, encounter notes, upcoming appointments, etc.  Non-urgent messages can be sent to your provider as well.   To learn more about what you can do with  MyChart, go to [email or web address].    Your next appointment:   1 month(s)  The format for your next appointment:   Either In Person or Virtual  Provider:   You may see Rollene Rotunda, MD or one of the following Advanced Practice Providers on your designated Care Team:     Joni Reining, DNP, ANP

## 2019-12-15 ENCOUNTER — Encounter: Payer: Self-pay | Admitting: Cardiology

## 2019-12-21 ENCOUNTER — Other Ambulatory Visit (HOSPITAL_COMMUNITY)
Admission: RE | Admit: 2019-12-21 | Discharge: 2019-12-21 | Disposition: A | Discharge status: Home / Self Care | Payer: Medicare Other | Source: Ambulatory Visit | Attending: Adult Health | Admitting: Adult Health

## 2019-12-21 ENCOUNTER — Telehealth (HOSPITAL_COMMUNITY): Payer: Self-pay | Admitting: *Deleted

## 2019-12-21 DIAGNOSIS — Z20822 Contact with and (suspected) exposure to covid-19: Secondary | ICD-10-CM | POA: Diagnosis not present

## 2019-12-21 DIAGNOSIS — Z01812 Encounter for preprocedural laboratory examination: Secondary | ICD-10-CM | POA: Insufficient documentation

## 2019-12-21 LAB — HEPATIC FUNCTION PANEL
ALT: 41 IU/L (ref 0–44)
AST: 28 IU/L (ref 0–40)
Albumin: 4.6 g/dL (ref 3.8–4.8)
Alkaline Phosphatase: 104 IU/L (ref 39–117)
Bilirubin Total: 0.8 mg/dL (ref 0.0–1.2)
Bilirubin, Direct: 0.28 mg/dL (ref 0.00–0.40)
Total Protein: 7.5 g/dL (ref 6.0–8.5)

## 2019-12-21 LAB — LIPID PANEL
Chol/HDL Ratio: 2.1 ratio (ref 0.0–5.0)
Cholesterol, Total: 118 mg/dL (ref 100–199)
HDL: 57 mg/dL (ref >39)
LDL Chol Calc (NIH): 50 mg/dL (ref 0–99)
Triglycerides: 47 mg/dL (ref 0–149)
VLDL Cholesterol Cal: 11 mg/dL (ref 5–40)

## 2019-12-21 LAB — BASIC METABOLIC PANEL
BUN/Creatinine Ratio: 14 (ref 10–24)
BUN: 13 mg/dL (ref 8–27)
CO2: 22 mmol/L (ref 20–29)
Calcium: 9.6 mg/dL (ref 8.6–10.2)
Chloride: 96 mmol/L (ref 96–106)
Creatinine, Ser: 0.95 mg/dL (ref 0.76–1.27)
GFR calc Af Amer: 95 mL/min/{1.73_m2} (ref >59)
GFR calc non Af Amer: 82 mL/min/{1.73_m2} (ref >59)
Glucose: 112 mg/dL — ABNORMAL HIGH (ref 65–99)
Potassium: 4.2 mmol/L (ref 3.5–5.2)
Sodium: 135 mmol/L (ref 134–144)

## 2019-12-21 LAB — SARS CORONAVIRUS 2 (TAT 6-24 HRS): SARS Coronavirus 2: NEGATIVE

## 2019-12-21 NOTE — Telephone Encounter (Signed)
Patient given detailed instructions per Myocardial Perfusion Study Information Sheet for the test on 12/24/19 at 10:15. Patient notified to arrive 15 minutes early and that it is imperative to arrive on time for appointment to keep from having the test rescheduled.  If you need to cancel or reschedule your appointment, please call the office within 24 hours of your appointment. . Patient verbalized understanding.Daneil Dolin

## 2019-12-24 ENCOUNTER — Other Ambulatory Visit: Payer: Self-pay

## 2019-12-24 ENCOUNTER — Ambulatory Visit (HOSPITAL_BASED_OUTPATIENT_CLINIC_OR_DEPARTMENT_OTHER): Payer: Medicare Other

## 2019-12-24 ENCOUNTER — Ambulatory Visit (HOSPITAL_COMMUNITY): Payer: Medicare Other | Attending: Internal Medicine

## 2019-12-24 DIAGNOSIS — I10 Essential (primary) hypertension: Secondary | ICD-10-CM

## 2019-12-24 DIAGNOSIS — I2510 Atherosclerotic heart disease of native coronary artery without angina pectoris: Secondary | ICD-10-CM | POA: Diagnosis present

## 2019-12-24 LAB — MYOCARDIAL PERFUSION IMAGING
Estimated workload: 11 METS
Exercise duration (min): 9 min
Exercise duration (sec): 35 s
LV dias vol: 67 mL (ref 62–150)
LV sys vol: 37 mL
MPHR: 153 {beats}/min
Peak HR: 162 {beats}/min
Percent HR: 105 %
Rest HR: 101 {beats}/min
SDS: 3
SRS: 1
SSS: 4
TID: 1.02

## 2019-12-24 LAB — ECHOCARDIOGRAM COMPLETE
Height: 67 in
Weight: 3328 oz

## 2019-12-24 MED ORDER — PERFLUTREN LIPID MICROSPHERE
1.0000 mL | INTRAVENOUS | Status: AC | PRN
  Administered 2019-12-24: 1 mL via INTRAVENOUS

## 2019-12-24 MED ORDER — TECHNETIUM TC 99M TETROFOSMIN IV KIT
32.1000 | PACK | Freq: Once | INTRAVENOUS | Status: AC | PRN
  Administered 2019-12-24: 32.1 via INTRAVENOUS
  Filled 2019-12-24: qty 33

## 2019-12-24 MED ORDER — TECHNETIUM TC 99M TETROFOSMIN IV KIT
9.7000 | PACK | Freq: Once | INTRAVENOUS | Status: AC | PRN
  Administered 2019-12-24: 9.7 via INTRAVENOUS
  Filled 2019-12-24: qty 10

## 2020-01-05 NOTE — Progress Notes (Signed)
Cardiology Office Note   Date:  01/10/2020   ID:  Douglas Kelley, DOB 1951-03-16, MRN [id number]  PCP:  Derrill Center., MD  Cardiologist: Dr. Percival Spanish  CC: Hypertension follow up   History of Present Illness: Douglas Kelley is a 69 y.o. male who presents for we are following for ongoing assessment and management of CAD. 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 Follow up cardiac cath in 2012 revealed patent LAD sent without new or significant CAD. EF of 51%.  He is intolerant to ACE inhibitors due to cough but can tolerate ARB's.   When seen last on 12/13/2019 he was having more fatigue. He was scheduled for a NM stress test to rule out new areas of ischemia.His BP was elevated. I started chlorthalidone 25 mg daily. Echo was ordered for changes in LV fx. We may consider OSA work up due to body habitus if medication adjustments did not improve BP.  NM stress test dated 12/24/2019, was shown to have a hypertensive response, and therefore an intermediate risk study, he had a fixed mild defect at the apex/apical anterior/apical septal walls seen at both rest and stress.   Echocardiogram on 12/24/2019 with normal EF of 60%-65%, with mild LVH. No significant valvular abnormalities.   He is doing very well today, blood pressure is excellently controlled.  He denies any changes in his his activity level as a result of medication changes.  He is tolerating them well.  He likes to remain active scuba diving, traveling, exploring remote areas of different countries.  He was feeling down and depressed the last office visit but since the weather has changed and he has become more active he feels much better.  Past Medical History:  Diagnosis Date  . Coronary artery disease    Anterior wall MI-2009 treated with a drug-eluting stent;  LHC 08/30/11: LM ok, pLAD stent ok with < 30%, OM2 50%, EF 51%  . Dyspepsia   . HLD (hyperlipidemia)   . Ischemic cardiomyopathy    EF 35-40% at ant MI in 2009;  Echo 12/09: mild  apical HK, EF 55%, mild MR  . Research study patient    REVEAL    Past Surgical History:  Procedure Laterality Date  . INGUINAL HERNIA REPAIR    . UMBILICAL HERNIA REPAIR       Current Outpatient Medications  Medication Sig Dispense Refill  . amLODipine (NORVASC) 2.5 MG tablet Take 1 tablet (2.5 mg total) by mouth daily. 90 tablet 3  . aspirin 81 MG tablet Take 81 mg by mouth daily.      Marland Kitchen atorvastatin (LIPITOR) 80 MG tablet TAKE 1 TABLET DAILY 90 tablet 3  . chlorthalidone (HYGROTON) 25 MG tablet Take 1 tablet (25 mg total) by mouth daily. 90 tablet 3  . ezetimibe (ZETIA) 10 MG tablet Take 1 tablet by mouth daily.    Marland Kitchen losartan (COZAAR) 50 MG tablet Take 50 mg by mouth daily.    . nitroGLYCERIN (NITROSTAT) 0.4 MG SL tablet Place 1 tablet (0.4 mg total) under the tongue every 5 (five) minutes as needed for chest pain. 25 tablet 4  . omeprazole (PRILOSEC) 40 MG capsule Take 1 capsule by mouth daily.    Marland Kitchen VIAGRA 100 MG tablet Take 100 mg by mouth as needed. NOT TAKE WITH NITRATES     No current facility-administered medications for this visit.    Allergies:   Ace inhibitors and Levofloxacin    Social History:  The patient  reports that he has never smoked. He has never used smokeless tobacco. He reports current alcohol use. He reports that he does not use drugs.   Family History:  The patient's family history is not on file.    ROS: All other systems are reviewed and negative. Unless otherwise mentioned in H&P    PHYSICAL EXAM: VS:  BP 120/70   Pulse 76   Ht 5\' 7"  (1.702 m)   Wt 210 lb (95.3 kg)   SpO2 95%   BMI 32.89 kg/m  , BMI Body mass index is 32.89 kg/m. GEN: Well nourished, well developed, in no acute distress HEENT: normal Neck: no JVD, carotid bruits, or masses Cardiac: RRR; no murmurs, rubs, or gallops,no edema  Respiratory:  Clear to auscultation bilaterally, normal work of breathing GI: soft, nontender, nondistended, + BS MS: no deformity or  atrophy Skin: warm and dry, no rash Neuro:  Strength and sensation are intact Psych: euthymic mood, full affect   EKG: Not completed this office visit  Recent Labs: 12/21/2019: ALT 41; BUN 13; Creatinine, Ser 0.95; Potassium 4.2; Sodium 135    Lipid Panel    Component Value Date/Time   CHOL 118 12/21/2019 1125   TRIG 47 12/21/2019 1125   HDL 57 12/21/2019 1125   CHOLHDL 2.1 12/21/2019 1125   CHOLHDL 3 01/03/2009 0953   VLDL 12.2 01/03/2009 0953   LDLCALC 50 12/21/2019 1125      Wt Readings from Last 3 Encounters:  01/10/20 210 lb (95.3 kg)  Jan 08, 2020 208 lb (94.3 kg)  12/13/19 208 lb (94.3 kg)      Other studies Reviewed: Echocardiogram January 08, 2020 1. Left ventricular ejection fraction, by estimation, is 60 to 65%. The  left ventricle has normal function. The left ventricle has no regional  wall motion abnormalities. There is mild left ventricular hypertrophy.  Indeterminate diastolic filling due to  E-A fusion.  2. Right ventricular systolic function is normal. The right ventricular  size is normal.  3. The mitral valve is normal in structure. No evidence of mitral valve  regurgitation.  4. The aortic valve is tricuspid. Aortic valve regurgitation is not  visualized. Mild aortic valve sclerosis is present, with no evidence of  aortic valve stenosis.  5. The inferior vena cava is normal in size with greater than 50%  respiratory variability, suggesting right atrial pressure   Stress Test 01-08-2020 Study Highlights    Nuclear stress EF: 45%.  Blood pressure demonstrated a hypertensive response to exercise.  There was no ST segment deviation noted during stress.  Defect 1: There is a medium defect of mild severity present in the apical anterior, apical septal and apex location.  This is an intermediate risk study.  The left ventricular ejection fraction is mildly decreased (45-54%).  No prior study for comparison.   There is a fixed mild defect at  the apex/apical anterior/apical septal walls seen at both rest and stress. EF calculated as mildly reduced (45%) but no focal wall motion abnormalities are seen. This is not consistent with ischemia. Overall low counts in this study.      ASSESSMENT AND PLAN:  1.  Coronary artery disease: History of DES to the LAD in 2009 with follow-up cardiac catheterization in 2012 with patent LAD stent.  On last office visit she was having more fatigue.  Nuclear medicine stress test was negative for new areas of ischemia.  Echocardiogram revealed LVH but normal LV systolic function.  He will continue current medication regimen.  2.  Hypertension: Excellent control of blood pressure today with changes in medication regimen.  Follow-up labs have been reviewed.  Kidney function is normal.  Continue current regiment.  3.  Hypercholesterolemia: Goal of LDL less than 70.  Review of labs reveals an LDL of 50.  He will continue atorvastatin 80 mg daily.    Current medicines are reviewed at length with the patient today.  I have spent 25 minutes  dedicated to the care of this patient on the date of this encounter to include pre-visit review of records, assessment, management and diagnostic testing,with shared decision making.  Labs/ tests ordered today include: None  Bettey Mare. Liborio Nixon, ANP, AACC   01/10/2020 10:02 AM    [facility] [address] Office [phone] Fax [phone]  Notice: This dictation was prepared with Dragon dictation along with smaller phrase technology. Any transcriptional errors that result from this process are unintentional and may not be corrected upon review.

## 2020-01-10 ENCOUNTER — Other Ambulatory Visit: Payer: Self-pay

## 2020-01-10 ENCOUNTER — Ambulatory Visit (INDEPENDENT_AMBULATORY_CARE_PROVIDER_SITE_OTHER): Payer: Medicare Other | Admitting: Adult Health

## 2020-01-10 ENCOUNTER — Encounter: Payer: Self-pay | Admitting: Adult Health

## 2020-01-10 VITALS — BP 120/70 | HR 76 | Ht 67.0 in | Wt 210.0 lb

## 2020-01-10 DIAGNOSIS — I10 Essential (primary) hypertension: Secondary | ICD-10-CM | POA: Diagnosis not present

## 2020-01-10 DIAGNOSIS — E7800 Pure hypercholesterolemia, unspecified: Secondary | ICD-10-CM

## 2020-01-10 DIAGNOSIS — I255 Ischemic cardiomyopathy: Secondary | ICD-10-CM

## 2020-01-10 DIAGNOSIS — I2510 Atherosclerotic heart disease of native coronary artery without angina pectoris: Secondary | ICD-10-CM

## 2020-01-10 NOTE — Patient Instructions (Signed)
Medication Instructions:  Continue current medications  *If you need a refill on your cardiac medications before your next appointment, please call your pharmacy*   Lab Work: None Ordered   Testing/Procedures: None Ordered   Follow-Up: At [facility], you and your health needs are our priority.  As part of our continuing mission to provide you with exceptional heart care, we have created designated Provider Care Teams.  These Care Teams include your primary Cardiologist (physician) and Advanced Practice Providers (APPs -  Physician Assistants and Nurse Practitioners) who all work together to provide you with the care you need, when you need it.  We recommend signing up for the patient portal called "MyChart".  Sign up information is provided on this After Visit Summary.  MyChart is used to connect with patients for Virtual Visits (Telemedicine).  Patients are able to view lab/test results, encounter notes, upcoming appointments, etc.  Non-urgent messages can be sent to your provider as well.   To learn more about what you can do with MyChart, go to [email or web address].    Your next appointment:   6 month(s)  The format for your next appointment:   In Person  Provider:   You may see James Hochrein, MD or one of the following Advanced Practice Providers on your designated Care Team:    Rhonda Barrett, PA-C  Kathryn Lawrence, DNP, ANP  Cadence Furth, NP     

## 2020-01-19 ENCOUNTER — Other Ambulatory Visit: Payer: Medicare Other

## 2020-01-20 ENCOUNTER — Other Ambulatory Visit: Payer: Medicare Other

## 2020-10-30 ENCOUNTER — Ambulatory Visit: Payer: Medicare Other | Admitting: Cardiology

## 2020-11-09 ENCOUNTER — Telehealth: Payer: Self-pay | Admitting: Adult Health

## 2020-11-09 MED ORDER — CHLORTHALIDONE 25 MG PO TABS: 25.0000 mg | ORAL_TABLET | Freq: Every day | ORAL | 1 refills | Status: DC

## 2020-11-09 NOTE — Telephone Encounter (Signed)
*  STAT* If patient is at the pharmacy, call can be transferred to refill team.   1. Which medications need to be refilled? (please list name of each medication and dose if known)  chlorthalidone (HYGROTON) 25 MG tablet  2. Which pharmacy/location (including street and city if local pharmacy) is medication to be sent to? [facility] - [place], [place] - [address]  3. Do they need a 30 day or 90 day supply? 90 with refills   Pharmacy needs a new rx for this patient. The patient has not had this medication sent to the mail order pharmacy before

## 2020-11-09 NOTE — Telephone Encounter (Signed)
Let patient know that a refill for his Chlorthalidone.

## 2020-11-10 ENCOUNTER — Other Ambulatory Visit: Payer: Self-pay

## 2020-11-10 MED ORDER — AMLODIPINE BESYLATE 2.5 MG PO TABS: 2.5000 mg | ORAL_TABLET | Freq: Every day | ORAL | 3 refills | Status: DC

## 2020-11-14 NOTE — Progress Notes (Signed)
Cardiology Office Note   Date:  11/14/2020   ID:  Fritz Cauthon, DOB [birth year], MRN [id number]  PCP:  Elijio Miles., MD  Cardiologist: Dr. Antoine Poche   CC: Routine Follow Up  History of Present Illness: Douglas Kelley is a 70 y.o. male who presents for ongoing assessment and management of coronary artery disease.  Douglas Kelley has a history of an anterior MI in 2009 with placement of a drug-eluting stent to the LAD.  He did have a follow-up cardiac catheterization in 2012 which revealed a patent LAD stent without new or significant coronary artery disease.  His EF was 51%.  It is noted that he is intolerant to ACE inhibitors due to cough but cannot tolerate ARB's.  The patient did have a follow-up nuclear medicine stress test on 12/13/2019 as he was having more complaints of fatigue.  This revealed no new areas of ischemia.  He was started on chlorthalidone 25 mg daily due to hypertensive response to exercise.  Echocardiogram dated 12/24/2019 revealed normal EF of 60% to 65% with mild LVH.  There were no significant valvular abnormalities.  He was last seen on 01/10/2020 at which time he was without complaints and blood pressure was excellently controlled.  He remains active scuba diving traveling [place].  He had had some periods of depression in the past but at that office vi sit he was feeling much better because he was back to traveling and doing activities he enjoyed.  He comes today without any complaints from a cardiac standpoint. He has "the usual aches and pains" but does not have any significant issues with chest pain, dyspnea on exertion, or significant fatigue. He continues to travel a lot. Over the last 3 months he was at the [employer] and then down in [place], and is now back home. He is also being followed by his primary care physician who would like to start him on testosterone. He is medically compliant.  Past Medical History:  Diagnosis Date    Coronary artery disease    Anterior wall MI-2009 treated with a drug-eluting stent;  LHC 08/30/11: LM ok, pLAD stent ok with < 30%, OM2 50%, EF 51%   Dyspepsia    HLD (hyperlipidemia)    Ischemic cardiomyopathy    EF 35-40% at ant MI in 2009;  Echo 12/09: mild apical HK, EF 55%, mild MR   Research study patient    REVEAL    Past Surgical History:  Procedure Laterality Date   INGUINAL HERNIA REPAIR     UMBILICAL HERNIA REPAIR       Current Outpatient Medications  Medication Sig Dispense Refill   amLODipine (NORVASC) 2.5 MG tablet Take 1 tablet (2.5 mg total) by mouth daily. 90 tablet 3   aspirin 81 MG tablet Take 81 mg by mouth daily.       atorvastatin (LIPITOR) 80 MG tablet TAKE 1 TABLET DAILY 90 tablet 3   chlorthalidone (HYGROTON) 25 MG tablet Take 1 tablet (25 mg total) by mouth daily. 90 tablet 1   ezetimibe (ZETIA) 10 MG tablet Take 1 tablet by mouth daily.     losartan (COZAAR) 50 MG tablet Take 50 mg by mouth daily.     nitroGLYCERIN (NITROSTAT) 0.4 MG SL tablet Place 1 tablet (0.4 mg total) under the tongue every 5 (five) minutes as needed for chest pain. 25 tablet 4   omeprazole (PRILOSEC) 40 MG capsule Take 1 capsule by mouth daily.  VIAGRA 100 MG tablet Take 100 mg by mouth as needed. NOT TAKE WITH NITRATES     No current facility-administered medications for this visit.    Allergies:   Ace inhibitors and Levofloxacin    Social History:  The patient  reports that he has never smoked. He has never used smokeless tobacco. He reports current alcohol use. He reports that he does not use drugs.   Family History:  The patient's family history is not on file.    ROS: All other systems are reviewed and negative. Unless otherwise mentioned in H&P    PHYSICAL EXAM: VS:  There were no vitals taken for this visit. , BMI There is no height or weight on file to calculate BMI. GEN: Well nourished, well developed, in no acute distress HEENT: normal Neck:  no JVD, carotid bruits, or masses Cardiac:RRR; no murmurs, rubs, or gallops,no edema  Respiratory:  Clear to auscultation bilaterally, normal work of breathing GI: soft, nontender, nondistended, + BS MS: no deformity or atrophy Skin: warm and dry, no rash Neuro:  Strength and sensation are intact Psych: euthymic mood, full affect   EKG:   Personally reviewed, normal sinus rhythm, heart rate of 68 bpm.  Recent Labs: 12/21/2019: ALT 41; BUN 13; Creatinine, Ser 0.95; Potassium 4.2; Sodium 135    Lipid Panel    Component Value Date/Time   CHOL 118 12/21/2019 1125   TRIG 47 12/21/2019 1125   HDL 57 12/21/2019 1125   CHOLHDL 2.1 12/21/2019 1125   CHOLHDL 3 01/03/2009 0953   VLDL 12.2 01/03/2009 0953   LDLCALC 50 12/21/2019 1125      Wt Readings from Last 3 Encounters:  01/10/20 210 lb (95.3 kg)  12/24/19 208 lb (94.3 kg)  12/13/19 208 lb (94.3 kg)      Other studies normal sinus rhythm heart rate of 61 bpm Echocardiogram 12/24/2019 1. Left ventricular ejection fraction, by estimation, is 60 to 65%. The  left ventricle has normal function. The left ventricle has no regional  wall motion abnormalities. There is mild left ventricular hypertrophy.  Indeterminate diastolic filling due to  E-A fusion.  2. Right ventricular systolic function is normal. The right ventricular  size is normal.  3. The mitral valve is normal in structure. No evidence of mitral valve  regurgitation.  4. The aortic valve is tricuspid. Aortic valve regurgitation is not  visualized. Mild aortic valve sclerosis is present, with no evidence of  aortic valve stenosis.  5. The inferior vena cava is normal in size with greater than 50%  respiratory variability, suggesting right atrial pressure   Stress Test 12/24/2019 Study Highlights    Nuclear stress EF: 45%.  Blood pressure demonstrated a hypertensive response to exercise.  There was no ST segment deviation noted during stress.  Defect 1:  There is a medium defect of mild severity present in the apical anterior, apical septal and apex location.  This is an intermediate risk study.  The left ventricular ejection fraction is mildly decreased (45-54%).  No prior study for comparison.  There is a fixed mild defect at the apex/apical anterior/apical septal walls seen at both rest and stress. EF calculated as mildly reduced (45%) but no focal wall motion abnormalities are seen. This is not consistent with ischemia. Overall low counts in this study.      ASSESSMENT AND PLAN:  1. Coronary artery disease: History of stent to the LAD in 2012. He is completely asymptomatic. Remains very active, travels, denies any recurrent  angina symptoms, and is well controlled on medical management which he adheres to. We will continue him on current regimen at this time. Follow-up with him in a year unless he becomes symptomatic.Concerning addison of low dose Testerone should not be a problem.There are a paucity of studies related to adverse CV effects on lower does in men. Begin low and see his response, especially with HR, breathing status, and blood pressure response.  ,  2. Hypertension: Excellent control of blood pressure under current medication regimen. No changes are planned. Follow-up labs are planned by PCP who has been drawing labs on follow-up.  3 hypercholesterolemia: Goal of LDL less than 70 per ACC guidelines. He will continue statin therapy as directed. Follow-up fasting lipids and LFTs will be completed on next appointment if not completed by PCP   Current medicines are reviewed at length with the patient today.  I have spent 25 minutes dedicated to the care of this patient on the date of this encounter to include pre-visit review of records, assessment, management and diagnostic testing,with shared decision making.  Labs/ tests ordered today include: None   Bettey Mare. Liborio Nixon, ANP, AACC   11/14/2020 5:28 PM    [facility] [address] Office [phone] Fax [phone]  Notice: This dictation was prepared with Dragon dictation along with smaller phrase technology. Any transcriptional errors that result from this process are unintentional and may not be corrected upon review.

## 2020-11-17 ENCOUNTER — Ambulatory Visit (INDEPENDENT_AMBULATORY_CARE_PROVIDER_SITE_OTHER): Payer: Medicare Other | Admitting: Adult Health

## 2020-11-17 ENCOUNTER — Encounter: Payer: Self-pay | Admitting: Adult Health

## 2020-11-17 ENCOUNTER — Other Ambulatory Visit: Payer: Self-pay

## 2020-11-17 VITALS — BP 130/72 | HR 91 | Ht 67.0 in | Wt 218.0 lb

## 2020-11-17 DIAGNOSIS — E7800 Pure hypercholesterolemia, unspecified: Secondary | ICD-10-CM | POA: Diagnosis not present

## 2020-11-17 DIAGNOSIS — I10 Essential (primary) hypertension: Secondary | ICD-10-CM

## 2020-11-17 DIAGNOSIS — I2510 Atherosclerotic heart disease of native coronary artery without angina pectoris: Secondary | ICD-10-CM

## 2020-11-17 NOTE — Patient Instructions (Signed)
Medication Instructions:  Continue current medications  *If you need a refill on your cardiac medications before your next appointment, please call your pharmacy*   Lab Work: None Ordered   Testing/Procedures: None Ordered   Follow-Up: At [facility], you and your health needs are our priority.  As part of our continuing mission to provide you with exceptional heart care, we have created designated Provider Care Teams.  These Care Teams include your primary Cardiologist (physician) and Advanced Practice Providers (APPs -  Physician Assistants and Nurse Practitioners) who all work together to provide you with the care you need, when you need it.  We recommend signing up for the patient portal called "MyChart".  Sign up information is provided on this After Visit Summary.  MyChart is used to connect with patients for Virtual Visits (Telemedicine).  Patients are able to view lab/test results, encounter notes, upcoming appointments, etc.  Non-urgent messages can be sent to your provider as well.   To learn more about what you can do with MyChart, go to [email or web address].    Your next appointment:   1 year(s)  The format for your next appointment:   In Person  Provider:   You may see James Hochrein, MD or one of the following Advanced Practice Providers on your designated Care Team:    Rhonda Barrett, PA-C  Kathryn Lawrence, DNP, ANP     

## 2021-02-04 ENCOUNTER — Other Ambulatory Visit: Payer: Self-pay | Admitting: Cardiology

## 2021-02-04 ENCOUNTER — Other Ambulatory Visit: Payer: Self-pay | Admitting: Adult Health

## 2021-07-09 ENCOUNTER — Other Ambulatory Visit: Payer: Self-pay | Admitting: Cardiology

## 2021-11-12 NOTE — Progress Notes (Signed)
Cardiology Office Note   Date:  11/13/2021   ID:  Douglas Kelley, DOB 31-Jan-1951, MRN [id number]  PCP:  Elijio Miles., MD  Cardiologist:   Rollene Rotunda, MD   Chief Complaint  Patient presents with   Coronary Artery Disease      History of Present Illness: Douglas Kelley is a 71 y.o. male who presents for follow up of CAD.  He had a history of 72 y/o male with a history of anterior MI in 2009 treated with LAD DES. He had a cardiomyopathy at the time of his MI but his EF had recovered by echo later that year.  F/U cath in 2012 demonstrated a patent LAD stent with no other significant CAD and an EF of 51%.  Last year I switched him from ACE to ARB because of cough.     Since I last saw him he has done very well.  The patient denies any new symptoms such as chest discomfort, neck or arm discomfort. There has been no new shortness of breath, PND or orthopnea. There have been no reported palpitations, presyncope or syncope.   He has been fishing recently in [place].  He has been walking his rescue [place] mixed breed named [name].  He has not been scuba diving but he still doing a lot of traveling.  He has been swimming 26 laps at the [facility].    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     atorvastatin (LIPITOR) 80 MG tablet TAKE 1 TABLET DAILY 90 tablet 3   ezetimibe (ZETIA) 10 MG tablet Take 1 tablet by mouth daily.     omeprazole  (PRILOSEC) 40 MG capsule Take 1 capsule by mouth daily.     Testosterone 1.62 % GEL Apply topically.     VIAGRA 100 MG tablet Take 100 mg by mouth as needed. NOT TAKE WITH NITRATES     amLODipine (NORVASC) 2.5 MG tablet TAKE 1 TABLET(2.5 MG) BY MOUTH DAILY 90 tablet 3   losartan (COZAAR) 50 MG tablet Take 1 tablet (50 mg total) by mouth daily. 90 tablet 3   nitroGLYCERIN (NITROSTAT) 0.4 MG SL tablet Place 1 tablet (0.4 mg total) under the tongue every 5 (five) minutes as needed for chest pain. 25 tablet 4   No current facility-administered medications for this visit.    Allergies:   Ace inhibitors and Levofloxacin     ROS:  Please see the history of present illness.   Otherwise, review of systems are positive for ED.   All other systems are reviewed and negative.    PHYSICAL EXAM: VS:  BP 132/88    Pulse 68    Ht 5\' 7"  (1.702 m)    Wt 210 lb 6.4 oz (95.4 kg)    SpO2 97%  BMI 32.95 kg/m  , BMI Body mass index is 32.95 kg/m. GENERAL:  Well appearing NECK:  No jugular venous distention, waveform within normal limits, carotid upstroke brisk and symmetric, no bruits, no thyromegaly LUNGS:  Clear to auscultation bilaterally CHEST:  Unremarkable HEART:  PMI not displaced or sustained,S1 and S2 within normal limits, no S3, no S4, no clicks, no rubs, no murmurs ABD:  Flat, positive bowel sounds normal in frequency in pitch, no bruits, no rebound, no guarding, no midline pulsatile mass, no hepatomegaly, no splenomegaly EXT:  2 plus pulses throughout, no edema, no cyanosis no clubbing  EKG:  EKG is  ordered today. The ekg ordered today demonstrates sinus rhythm, rate 68, axis within normal limits, intervals within normal limits, poor anterior R wave progression, old anteroseptal infarct.  Recent Labs: No results found for requested labs within last 8760 hours.     Wt Readings from Last 3 Encounters:  11/13/21 210 lb 6.4 oz (95.4 kg)  11/17/20 218 lb (98.9 kg)  01/10/20 210 lb (95.3 kg)       Other studies Reviewed: Additional studies/ records that were reviewed today include:  Labs Review of the above records demonstrates:  Please see elsewhere in the note.     ASSESSMENT AND PLAN:  CAD:   The patient has no new sypmtoms.  No further cardiovascular testing is indicated.  We will continue with aggressive risk reduction and meds as listed.  Essential hypertension The blood pressure is at target.  No change in therapy.   Ischemic cardiomyopathy His initial ejection fraction after the MI was 35% but was 60 - 65% in July 2021.  No further imaging is indicated.  Dyslipidemia His LDL he reports was 66 with an HDL of 47.  No change in therapy.   ED He knows not to take Viagra with NTG.     Current medicines are reviewed at length with the patient today.  The patient does not have concerns regarding medicines.  The following changes have been made:  None  Labs/ tests ordered today include:   None  Orders Placed This Encounter  Procedures   EKG 12-Lead     Disposition:   FU with me in one year.    Signed, Rollene Rotunda, MD  11/13/2021 4:49 PM    [facility]

## 2021-11-13 ENCOUNTER — Encounter: Payer: Self-pay | Admitting: Cardiology

## 2021-11-13 ENCOUNTER — Ambulatory Visit (INDEPENDENT_AMBULATORY_CARE_PROVIDER_SITE_OTHER): Payer: Medicare Other | Admitting: Cardiology

## 2021-11-13 ENCOUNTER — Other Ambulatory Visit: Payer: Self-pay

## 2021-11-13 VITALS — BP 132/88 | HR 68 | Ht 67.0 in | Wt 210.4 lb

## 2021-11-13 DIAGNOSIS — I255 Ischemic cardiomyopathy: Secondary | ICD-10-CM

## 2021-11-13 DIAGNOSIS — E785 Hyperlipidemia, unspecified: Secondary | ICD-10-CM

## 2021-11-13 DIAGNOSIS — I2510 Atherosclerotic heart disease of native coronary artery without angina pectoris: Secondary | ICD-10-CM | POA: Diagnosis not present

## 2021-11-13 DIAGNOSIS — I10 Essential (primary) hypertension: Secondary | ICD-10-CM

## 2021-11-13 DIAGNOSIS — R059 Cough, unspecified: Secondary | ICD-10-CM

## 2021-11-13 MED ORDER — AMLODIPINE BESYLATE 2.5 MG PO TABS
ORAL_TABLET | ORAL | 3 refills | Status: AC
Start: 2021-11-13 — End: ?

## 2021-11-13 MED ORDER — LOSARTAN POTASSIUM 50 MG PO TABS: 50.0000 mg | ORAL_TABLET | Freq: Every day | ORAL | 3 refills | Status: AC

## 2021-11-13 MED ORDER — NITROGLYCERIN 0.4 MG SL SUBL: 0.4000 mg | SUBLINGUAL_TABLET | SUBLINGUAL | 4 refills | Status: AC | PRN

## 2021-11-13 NOTE — Patient Instructions (Signed)
Medication Instructions:   Not needed *If you need a refill on your cardiac medications before your next appointment, please call your pharmacy*   Lab Work: No changes    Testing/Procedures: Not needed   Follow-Up: At [facility], you and your health needs are our priority.  As part of our continuing mission to provide you with exceptional heart care, we have created designated Provider Care Teams.  These Care Teams include your primary Cardiologist (physician) and Advanced Practice Providers (APPs -  Physician Assistants and Nurse Practitioners) who all work together to provide you with the care you need, when you need it.     Your next appointment:   12 month(s)  The format for your next appointment:   In Person  Provider:   Rollene Rotunda, MD

## 2023-03-05 NOTE — Progress Notes (Unsigned)
  Cardiology Office Note:   Date:  03/06/2023  ID:  Douglas Kelley, DOB 1950-11-26, MRN [id number]  History of Present Illness:   Douglas Kelley is a 72 y.o. male who presents for follow up of CAD.  He had a history of 72 y/o male with a history of anterior MI in 2009 treated with LAD DES. He had a cardiomyopathy at the time of his MI but his EF had recovered by echo later that year.  F/U cath in 2012 demonstrated a patent LAD stent with no other significant CAD and an EF of 51%.  Last year I switched him from ACE to ARB because of cough.     Since I last saw him he has done well.  He just went hiking in [place].  He is exercising.  He is raising Douglas Kelley his great Dane.  The patient denies any new symptoms such as chest discomfort, neck or arm discomfort. There has been no new shortness of breath, PND or orthopnea. There have been no reported palpitations, presyncope or syncope.   ROS: As stated in the HPI and negative for all other systems.  Studies Reviewed:    EKG: Sinus rhythm, rate 61, axis within normal limits, intervals within normal limits, no acute ST-T wave changes.   Risk Assessment/Calculations:              Physical Exam:   VS:  BP 124/74   Pulse 61   Ht 5\' 3"  (1.6 m)   Wt 203 lb 6.4 oz (92.3 kg)   SpO2 93%   BMI 36.03 kg/m    Wt Readings from Last 3 Encounters:  03/06/23 203 lb 6.4 oz (92.3 kg)  11/13/21 210 lb 6.4 oz (95.4 kg)  11/17/20 218 lb (98.9 kg)     GEN: Well nourished, well developed in no acute distress NECK: No JVD; No carotid bruits CARDIAC: RRR, no murmurs, rubs, gallops RESPIRATORY:  Clear to auscultation without rales, wheezing or rhonchi  ABDOMEN: Soft, non-tender, non-distended EXTREMITIES:  No edema; No deformity   ASSESSMENT AND PLAN:   CAD:   The patient has no new sypmtoms.  No further cardiovascular testing is indicated.  We will continue with aggressive risk reduction and meds as listed.   Essential hypertension The blood pressure is at target. No  change in medications is indicated. We will continue with therapeutic lifestyle changes (TLC).  Ischemic cardiomyopathy His initial ejection fraction after the MI was 35% but was 60 - 65% in July 2021.  No change in therapy.   Dyslipidemia His LDL needs to be checked with an LP(a).  Goals of therapy is an LDL in the 50s.          Signed, Rollene Rotunda, MD

## 2023-03-06 ENCOUNTER — Ambulatory Visit: Payer: Medicare Other | Attending: Cardiology | Admitting: Cardiology

## 2023-03-06 ENCOUNTER — Encounter: Payer: Self-pay | Admitting: Cardiology

## 2023-03-06 VITALS — BP 124/74 | HR 61 | Ht 63.0 in | Wt 203.4 lb

## 2023-03-06 DIAGNOSIS — I2510 Atherosclerotic heart disease of native coronary artery without angina pectoris: Secondary | ICD-10-CM | POA: Diagnosis not present

## 2023-03-06 DIAGNOSIS — I10 Essential (primary) hypertension: Secondary | ICD-10-CM

## 2023-03-06 DIAGNOSIS — E785 Hyperlipidemia, unspecified: Secondary | ICD-10-CM

## 2023-03-06 DIAGNOSIS — I255 Ischemic cardiomyopathy: Secondary | ICD-10-CM

## 2023-03-06 LAB — LIPID PANEL: Triglycerides: 51 mg/dL (ref 0–149)

## 2023-03-06 LAB — LIPOPROTEIN A (LPA)

## 2023-03-06 NOTE — Patient Instructions (Signed)
Medication Instructions:  The current medical regimen is effective;  continue present plan and medications.   *If you need a refill on your cardiac medications before your next appointment, please call your pharmacy*   Lab Work: LIPID, LPa today  If you have labs (blood work) drawn today and your tests are completely normal, you will receive your results only by: MyChart Message (if you have MyChart) OR A paper copy in the mail If you have any lab test that is abnormal or we need to change your treatment, we will call you to review the results.   Follow-Up: At [facility], you and your health needs are our priority.  As part of our continuing mission to provide you with exceptional heart care, we have created designated Provider Care Teams.  These Care Teams include your primary Cardiologist (physician) and Advanced Practice Providers (APPs -  Physician Assistants and Nurse Practitioners) who all work together to provide you with the care you need, when you need it.  We recommend signing up for the patient portal called "MyChart".  Sign up information is provided on this After Visit Summary.  MyChart is used to connect with patients for Virtual Visits (Telemedicine).  Patients are able to view lab/test results, encounter notes, upcoming appointments, etc.  Non-urgent messages can be sent to your provider as well.   To learn more about what you can do with MyChart, go to [email or web address].    Your next appointment:   18 month(s)  Provider:   Rollene Rotunda, MD

## 2023-03-07 LAB — LIPID PANEL
Chol/HDL Ratio: 2.7 ratio (ref 0.0–5.0)
Cholesterol, Total: 109 mg/dL (ref 100–199)
HDL: 40 mg/dL (ref >39)
LDL Chol Calc (NIH): 57 mg/dL (ref 0–99)
VLDL Cholesterol Cal: 12 mg/dL (ref 5–40)

## 2023-03-10 ENCOUNTER — Encounter: Payer: Self-pay | Admitting: *Deleted
